# Patient Record
Sex: Female | Born: 1970 | Race: Black or African American | Hispanic: No | Marital: Married | Smoking: Never smoker
Health system: Southern US, Community
[De-identification: ages and names within clinical notes are randomized; demographics above are authoritative.]

## PROBLEM LIST (undated history)

## (undated) DIAGNOSIS — O24419 Gestational diabetes mellitus in pregnancy, unspecified control: Secondary | ICD-10-CM

## (undated) DIAGNOSIS — D219 Benign neoplasm of connective and other soft tissue, unspecified: Secondary | ICD-10-CM

## (undated) DIAGNOSIS — I10 Essential (primary) hypertension: Secondary | ICD-10-CM

## (undated) DIAGNOSIS — F329 Major depressive disorder, single episode, unspecified: Secondary | ICD-10-CM

## (undated) DIAGNOSIS — R51 Headache: Secondary | ICD-10-CM

## (undated) DIAGNOSIS — F32A Depression, unspecified: Secondary | ICD-10-CM

## (undated) DIAGNOSIS — J45909 Unspecified asthma, uncomplicated: Secondary | ICD-10-CM

## (undated) DIAGNOSIS — R011 Cardiac murmur, unspecified: Secondary | ICD-10-CM

## (undated) DIAGNOSIS — F419 Anxiety disorder, unspecified: Secondary | ICD-10-CM

## (undated) DIAGNOSIS — E049 Nontoxic goiter, unspecified: Secondary | ICD-10-CM

## (undated) DIAGNOSIS — E041 Nontoxic single thyroid nodule: Secondary | ICD-10-CM

## (undated) HISTORY — PX: TONSILLECTOMY: SUR1361

## (undated) HISTORY — PX: WISDOM TOOTH EXTRACTION: SHX21

## (undated) HISTORY — PX: CYST EXCISION: SHX5701

## (undated) HISTORY — PX: THYROIDECTOMY: SHX17

## (undated) HISTORY — PX: INDUCED ABORTION: SHX677

## (undated) SURGERY — Surgical Case
Anesthesia: *Unknown

---

## 2003-07-09 ENCOUNTER — Emergency Department (HOSPITAL_COMMUNITY): Admission: EM | Admit: 2003-07-09 | Discharge: 2003-07-09 | Payer: Self-pay | Admitting: Emergency Medicine

## 2003-08-28 ENCOUNTER — Encounter: Payer: Self-pay | Admitting: Obstetrics and Gynecology

## 2003-08-28 ENCOUNTER — Inpatient Hospital Stay (HOSPITAL_COMMUNITY): Admission: AD | Admit: 2003-08-28 | Discharge: 2003-08-28 | Payer: Self-pay | Admitting: Obstetrics and Gynecology

## 2003-11-20 ENCOUNTER — Ambulatory Visit (HOSPITAL_COMMUNITY): Admission: RE | Admit: 2003-11-20 | Discharge: 2003-11-20 | Payer: Self-pay | Admitting: *Deleted

## 2003-11-28 ENCOUNTER — Inpatient Hospital Stay (HOSPITAL_COMMUNITY): Admission: AD | Admit: 2003-11-28 | Discharge: 2003-11-28 | Payer: Self-pay | Admitting: *Deleted

## 2004-02-05 ENCOUNTER — Inpatient Hospital Stay (HOSPITAL_COMMUNITY): Admission: AD | Admit: 2004-02-05 | Discharge: 2004-02-09 | Payer: Self-pay | Admitting: Obstetrics & Gynecology

## 2004-02-12 ENCOUNTER — Encounter: Admission: RE | Admit: 2004-02-12 | Discharge: 2004-02-12 | Payer: Self-pay | Admitting: *Deleted

## 2004-02-19 ENCOUNTER — Encounter: Admission: RE | Admit: 2004-02-19 | Discharge: 2004-02-19 | Payer: Self-pay | Admitting: *Deleted

## 2004-02-21 ENCOUNTER — Inpatient Hospital Stay (HOSPITAL_COMMUNITY): Admission: AD | Admit: 2004-02-21 | Discharge: 2004-02-21 | Payer: Self-pay | Admitting: Obstetrics & Gynecology

## 2004-02-25 ENCOUNTER — Encounter: Admission: RE | Admit: 2004-02-25 | Discharge: 2004-02-25 | Payer: Self-pay | Admitting: Obstetrics & Gynecology

## 2004-02-27 ENCOUNTER — Encounter: Admission: RE | Admit: 2004-02-27 | Discharge: 2004-02-27 | Payer: Self-pay | Admitting: *Deleted

## 2004-03-04 ENCOUNTER — Encounter: Admission: RE | Admit: 2004-03-04 | Discharge: 2004-03-04 | Payer: Self-pay | Admitting: *Deleted

## 2004-03-11 ENCOUNTER — Inpatient Hospital Stay (HOSPITAL_COMMUNITY): Admission: RE | Admit: 2004-03-11 | Discharge: 2004-03-14 | Payer: Self-pay | Admitting: Obstetrics and Gynecology

## 2004-03-11 ENCOUNTER — Encounter: Admission: RE | Admit: 2004-03-11 | Discharge: 2004-03-11 | Payer: Self-pay | Admitting: *Deleted

## 2004-03-18 ENCOUNTER — Encounter: Admission: RE | Admit: 2004-03-18 | Discharge: 2004-03-18 | Payer: Self-pay | Admitting: *Deleted

## 2004-03-21 ENCOUNTER — Inpatient Hospital Stay (HOSPITAL_COMMUNITY): Admission: AD | Admit: 2004-03-21 | Discharge: 2004-03-22 | Payer: Self-pay | Admitting: Obstetrics and Gynecology

## 2004-03-25 ENCOUNTER — Encounter: Admission: RE | Admit: 2004-03-25 | Discharge: 2004-03-25 | Payer: Self-pay | Admitting: *Deleted

## 2004-04-01 ENCOUNTER — Encounter: Admission: RE | Admit: 2004-04-01 | Discharge: 2004-04-01 | Payer: Self-pay | Admitting: Obstetrics & Gynecology

## 2004-04-08 ENCOUNTER — Encounter: Admission: RE | Admit: 2004-04-08 | Discharge: 2004-04-08 | Payer: Self-pay | Admitting: *Deleted

## 2004-04-09 ENCOUNTER — Inpatient Hospital Stay (HOSPITAL_COMMUNITY): Admission: AD | Admit: 2004-04-09 | Discharge: 2004-04-11 | Payer: Self-pay | Admitting: Obstetrics and Gynecology

## 2006-04-20 ENCOUNTER — Inpatient Hospital Stay (HOSPITAL_COMMUNITY): Admission: RE | Admit: 2006-04-20 | Discharge: 2006-04-25 | Payer: Self-pay | Admitting: Psychiatry

## 2006-04-21 ENCOUNTER — Ambulatory Visit: Payer: Self-pay | Admitting: Psychiatry

## 2007-09-10 ENCOUNTER — Emergency Department (HOSPITAL_COMMUNITY): Admission: EM | Admit: 2007-09-10 | Discharge: 2007-09-10 | Payer: Self-pay | Admitting: Family Medicine

## 2008-08-22 ENCOUNTER — Emergency Department (HOSPITAL_COMMUNITY): Admission: EM | Admit: 2008-08-22 | Discharge: 2008-08-22 | Payer: Self-pay | Admitting: Family Medicine

## 2008-08-22 ENCOUNTER — Inpatient Hospital Stay (HOSPITAL_COMMUNITY): Admission: AD | Admit: 2008-08-22 | Discharge: 2008-08-22 | Payer: Self-pay | Admitting: Gynecology

## 2008-08-26 ENCOUNTER — Ambulatory Visit: Payer: Self-pay | Admitting: *Deleted

## 2008-09-03 ENCOUNTER — Ambulatory Visit (HOSPITAL_COMMUNITY): Admission: RE | Admit: 2008-09-03 | Discharge: 2008-09-03 | Payer: Self-pay | Admitting: Obstetrics & Gynecology

## 2008-09-25 ENCOUNTER — Ambulatory Visit: Payer: Self-pay | Admitting: Obstetrics and Gynecology

## 2008-09-25 ENCOUNTER — Encounter: Payer: Self-pay | Admitting: Obstetrics and Gynecology

## 2008-10-01 ENCOUNTER — Ambulatory Visit: Payer: Self-pay | Admitting: Family Medicine

## 2008-10-01 LAB — CONVERTED CEMR LAB
ALT: 15 units/L (ref 0–35)
AST: 18 units/L (ref 0–37)
Albumin: 4.6 g/dL (ref 3.5–5.2)
Alkaline Phosphatase: 50 units/L (ref 39–117)
BUN: 12 mg/dL (ref 6–23)
Basophils Absolute: 0 10*3/uL (ref 0.0–0.1)
Basophils Relative: 0 % (ref 0–1)
CO2: 22 meq/L (ref 19–32)
Calcium: 9.2 mg/dL (ref 8.4–10.5)
Chloride: 101 meq/L (ref 96–112)
Cholesterol: 162 mg/dL (ref 0–200)
Creatinine, Ser: 0.68 mg/dL (ref 0.40–1.20)
Eosinophils Absolute: 0.2 10*3/uL (ref 0.0–0.7)
Eosinophils Relative: 2 % (ref 0–5)
Free T4: 1.48 ng/dL (ref 0.89–1.80)
Free Thyroxine Index: 3.7 (ref 1.0–3.9)
Glucose, Bld: 83 mg/dL (ref 70–99)
HCT: 38.6 % (ref 36.0–46.0)
HDL: 41 mg/dL (ref >39)
Hemoglobin: 13.3 g/dL (ref 12.0–15.0)
LDL Cholesterol: 112 mg/dL — ABNORMAL HIGH (ref 0–99)
Lymphocytes Relative: 30 % (ref 12–46)
Lymphs Abs: 2.4 10*3/uL (ref 0.7–4.0)
MCHC: 34.5 g/dL (ref 30.0–36.0)
MCV: 81.4 fL (ref 78.0–100.0)
Monocytes Absolute: 0.5 10*3/uL (ref 0.1–1.0)
Monocytes Relative: 7 % (ref 3–12)
Neutro Abs: 5 10*3/uL (ref 1.7–7.7)
Neutrophils Relative %: 61 % (ref 43–77)
Platelets: 225 10*3/uL (ref 150–400)
Potassium: 3.6 meq/L (ref 3.5–5.3)
RBC: 4.74 M/uL (ref 3.87–5.11)
RDW: 14.1 % (ref 11.5–15.5)
Sodium: 137 meq/L (ref 135–145)
T3 Uptake Ratio: 34.1 % (ref 22.5–37.0)
T4, Total: 10.8 ug/dL (ref 5.0–12.5)
TSH: 0.92 microintl units/mL (ref 0.350–4.50)
Total Bilirubin: 0.6 mg/dL (ref 0.3–1.2)
Total CHOL/HDL Ratio: 4
Total Protein: 8.1 g/dL (ref 6.0–8.3)
Triglycerides: 44 mg/dL (ref <150)
VLDL: 9 mg/dL (ref 0–40)
Vit D, 1,25-Dihydroxy: 12 — ABNORMAL LOW (ref 30–89)
WBC: 8.1 10*3/uL (ref 4.0–10.5)

## 2008-12-04 ENCOUNTER — Ambulatory Visit: Payer: Self-pay | Admitting: Family Medicine

## 2008-12-04 LAB — CONVERTED CEMR LAB
HCT: 38.4 % (ref 36.0–46.0)
Hemoglobin: 13.5 g/dL (ref 12.0–15.0)
MCHC: 35.2 g/dL (ref 30.0–36.0)
MCV: 81.7 fL (ref 78.0–100.0)
Platelets: 223 10*3/uL (ref 150–400)
RBC: 4.7 M/uL (ref 3.87–5.11)
RDW: 14.1 % (ref 11.5–15.5)
TSH: 0.82 microintl units/mL (ref 0.350–4.50)
WBC: 8.1 10*3/uL (ref 4.0–10.5)

## 2008-12-05 ENCOUNTER — Emergency Department (HOSPITAL_COMMUNITY): Admission: EM | Admit: 2008-12-05 | Discharge: 2008-12-05 | Payer: Self-pay | Admitting: Family Medicine

## 2009-01-02 ENCOUNTER — Ambulatory Visit: Payer: Self-pay | Admitting: Obstetrics and Gynecology

## 2009-01-02 ENCOUNTER — Other Ambulatory Visit: Admission: RE | Admit: 2009-01-02 | Discharge: 2009-01-02 | Payer: Self-pay | Admitting: Obstetrics and Gynecology

## 2009-01-17 ENCOUNTER — Ambulatory Visit: Payer: Self-pay | Admitting: Obstetrics and Gynecology

## 2009-01-22 ENCOUNTER — Ambulatory Visit: Payer: Self-pay | Admitting: Family Medicine

## 2009-02-08 ENCOUNTER — Emergency Department (HOSPITAL_COMMUNITY): Admission: EM | Admit: 2009-02-08 | Discharge: 2009-02-09 | Payer: Self-pay | Admitting: Emergency Medicine

## 2009-02-13 ENCOUNTER — Ambulatory Visit: Payer: Self-pay | Admitting: Family Medicine

## 2009-02-27 ENCOUNTER — Ambulatory Visit: Payer: Self-pay | Admitting: Family Medicine

## 2009-02-27 LAB — CONVERTED CEMR LAB
Free T4: 1.16 ng/dL (ref 0.89–1.80)
T3 Uptake Ratio: 27.2 % (ref 22.5–37.0)
T4, Total: 15.4 ug/dL — ABNORMAL HIGH (ref 5.0–12.5)

## 2009-03-17 ENCOUNTER — Ambulatory Visit (HOSPITAL_COMMUNITY): Admission: RE | Admit: 2009-03-17 | Discharge: 2009-03-17 | Payer: Self-pay | Admitting: Family Medicine

## 2009-05-13 ENCOUNTER — Emergency Department (HOSPITAL_COMMUNITY): Admission: EM | Admit: 2009-05-13 | Discharge: 2009-05-13 | Payer: Self-pay | Admitting: Emergency Medicine

## 2009-05-20 ENCOUNTER — Ambulatory Visit: Payer: Self-pay | Admitting: Family Medicine

## 2009-06-24 ENCOUNTER — Encounter (INDEPENDENT_AMBULATORY_CARE_PROVIDER_SITE_OTHER): Payer: Self-pay | Admitting: Interventional Radiology

## 2009-06-24 ENCOUNTER — Ambulatory Visit (HOSPITAL_COMMUNITY): Admission: RE | Admit: 2009-06-24 | Discharge: 2009-06-24 | Payer: Self-pay | Admitting: Family Medicine

## 2009-09-02 ENCOUNTER — Ambulatory Visit: Payer: Self-pay | Admitting: Family Medicine

## 2009-09-02 ENCOUNTER — Telehealth (INDEPENDENT_AMBULATORY_CARE_PROVIDER_SITE_OTHER): Payer: Self-pay | Admitting: *Deleted

## 2009-09-02 LAB — CONVERTED CEMR LAB
BUN: 8 mg/dL (ref 6–23)
CO2: 26 meq/L (ref 19–32)
Calcium: 9.3 mg/dL (ref 8.4–10.5)
Chloride: 100 meq/L (ref 96–112)
Creatinine, Ser: 0.63 mg/dL (ref 0.40–1.20)
Glucose, Bld: 67 mg/dL — ABNORMAL LOW (ref 70–99)
Potassium: 3.7 meq/L (ref 3.5–5.3)
Sodium: 140 meq/L (ref 135–145)
T3, Total: 181.6 ng/dL (ref 80.0–204.0)
T4, Total: 13.9 ug/dL — ABNORMAL HIGH (ref 5.0–12.5)
Vit D, 25-Hydroxy: 31 ng/mL (ref 30–89)

## 2009-10-11 ENCOUNTER — Emergency Department (HOSPITAL_COMMUNITY): Admission: EM | Admit: 2009-10-11 | Discharge: 2009-10-11 | Payer: Self-pay | Admitting: Family Medicine

## 2010-02-12 ENCOUNTER — Ambulatory Visit: Payer: Self-pay | Admitting: Family Medicine

## 2010-02-12 LAB — CONVERTED CEMR LAB
BUN: 11 mg/dL (ref 6–23)
CO2: 25 meq/L (ref 19–32)
Calcium: 9.8 mg/dL (ref 8.4–10.5)
Chloride: 98 meq/L (ref 96–112)
Creatinine, Ser: 0.71 mg/dL (ref 0.40–1.20)
Free T4: 1.13 ng/dL (ref 0.80–1.80)
Glucose, Bld: 118 mg/dL — ABNORMAL HIGH (ref 70–99)
Helicobacter Pylori Antibody-IgG: >8 — ABNORMAL HIGH
Potassium: 3.2 meq/L — ABNORMAL LOW (ref 3.5–5.3)
Sodium: 137 meq/L (ref 135–145)
T3 Uptake Ratio: 31.9 % (ref 22.5–37.0)

## 2010-02-25 ENCOUNTER — Telehealth (INDEPENDENT_AMBULATORY_CARE_PROVIDER_SITE_OTHER): Payer: Self-pay | Admitting: Family Medicine

## 2010-03-05 ENCOUNTER — Ambulatory Visit: Payer: Self-pay | Admitting: Family Medicine

## 2010-03-05 LAB — CONVERTED CEMR LAB
BUN: 10 mg/dL (ref 6–23)
CO2: 24 meq/L (ref 19–32)
Calcium: 9.4 mg/dL (ref 8.4–10.5)
Chloride: 100 meq/L (ref 96–112)
Creatinine, Ser: 0.67 mg/dL (ref 0.40–1.20)
Glucose, Bld: 75 mg/dL (ref 70–99)
Potassium: 3.7 meq/L (ref 3.5–5.3)
Sodium: 137 meq/L (ref 135–145)

## 2010-04-17 ENCOUNTER — Ambulatory Visit: Payer: Self-pay | Admitting: Family Medicine

## 2010-05-13 ENCOUNTER — Ambulatory Visit: Payer: Self-pay | Admitting: Obstetrics and Gynecology

## 2010-05-29 ENCOUNTER — Ambulatory Visit: Payer: Self-pay | Admitting: Obstetrics & Gynecology

## 2010-06-11 ENCOUNTER — Ambulatory Visit: Payer: Self-pay | Admitting: Obstetrics and Gynecology

## 2010-06-12 ENCOUNTER — Inpatient Hospital Stay (HOSPITAL_COMMUNITY): Admission: AD | Admit: 2010-06-12 | Discharge: 2010-06-13 | Payer: Self-pay | Admitting: Obstetrics & Gynecology

## 2010-06-13 ENCOUNTER — Emergency Department (HOSPITAL_COMMUNITY): Admission: EM | Admit: 2010-06-13 | Discharge: 2010-06-13 | Payer: Self-pay | Admitting: Family Medicine

## 2010-06-14 IMAGING — US US BIOPSY
1 series · 14 of 20 positions shown · non-contrast
Comparison: none

CLINICAL DATA: Thyroid nodules; dominant left nodule 4.5 cm

ULTRASOUND-GUIDED NEEDLE ASPIRATE BIOPSY, LEFT LOBE OF THYROID
The above procedure was discussed with the patient and written
informed consent was obtained.

[Series 1: us biopsy · 0.11mm/px · 14 of 20 slices shown]
[im 1/20]
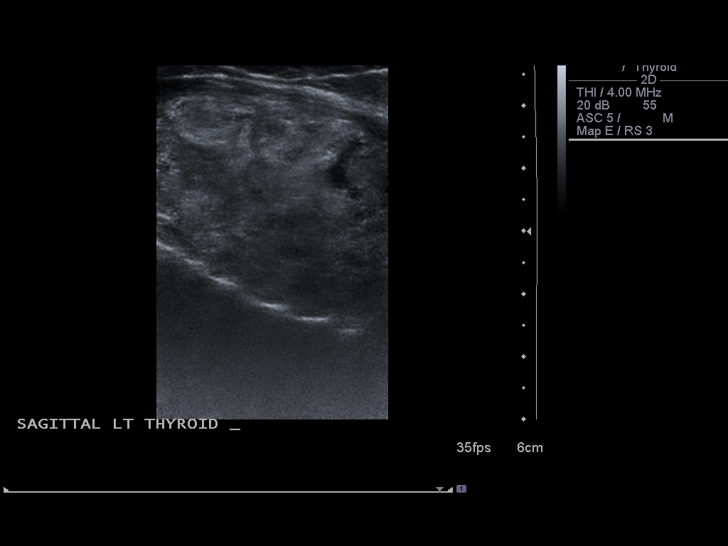
[im 3/20]
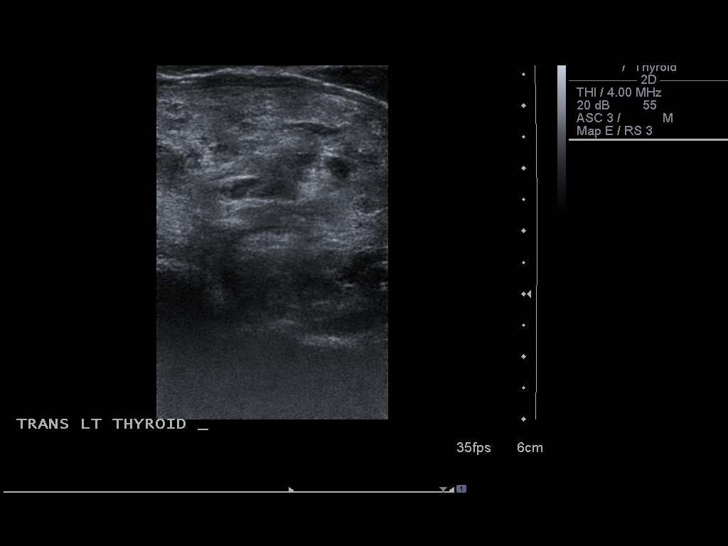
[im 4/20]
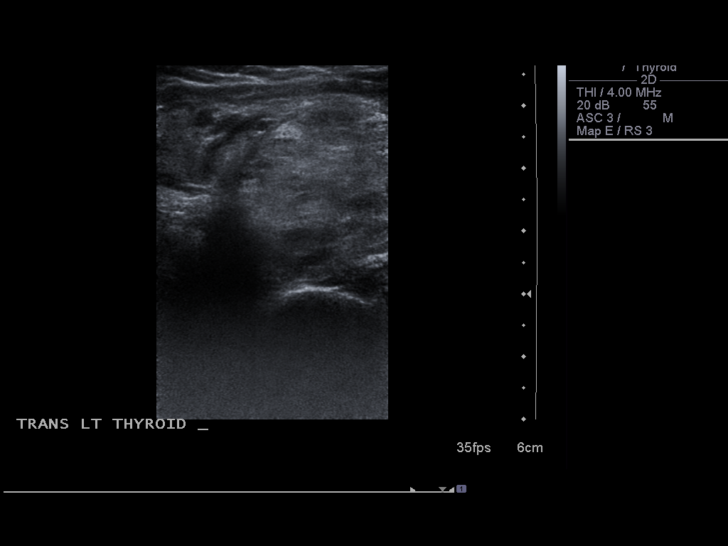
[im 6/20]
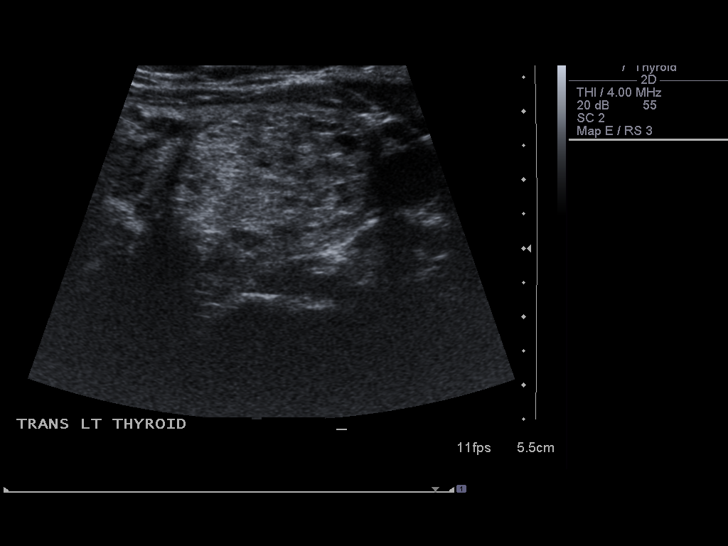
[im 7/20]
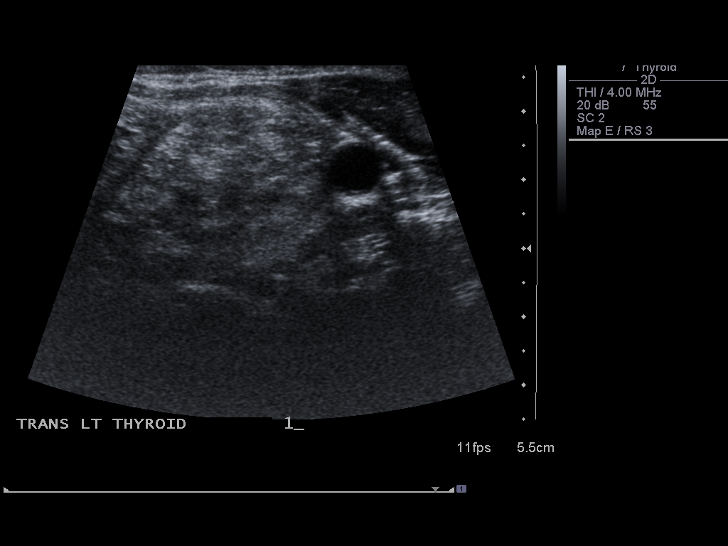
[im 8/20]
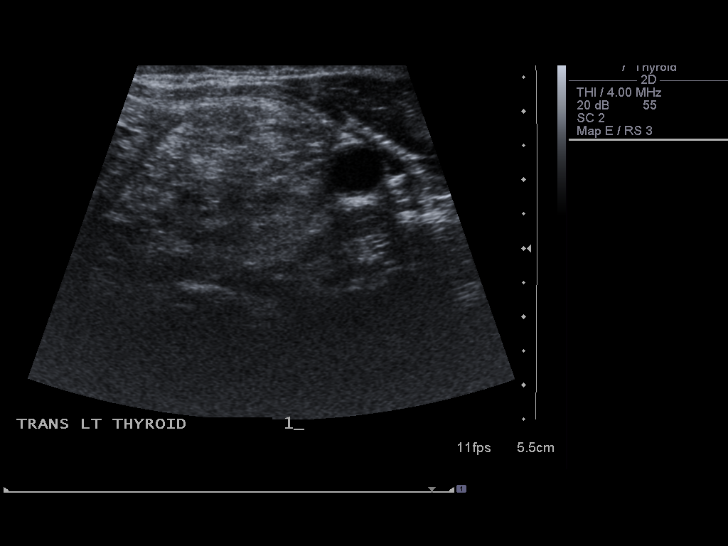
[im 10/20]
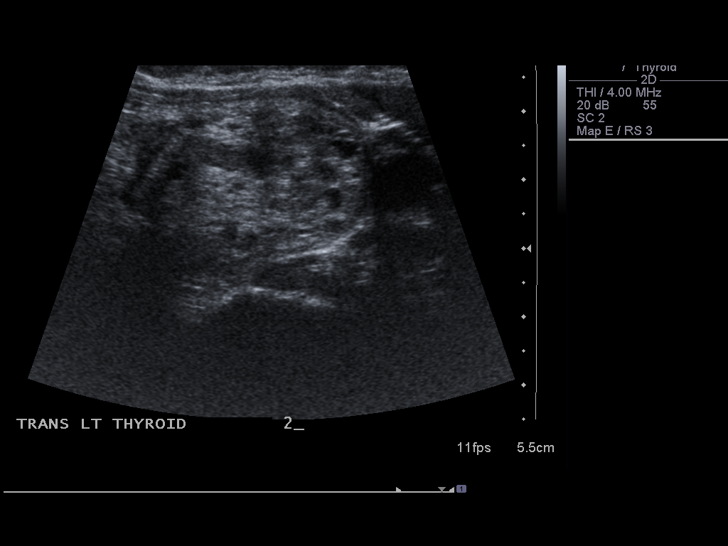
[im 11/20]
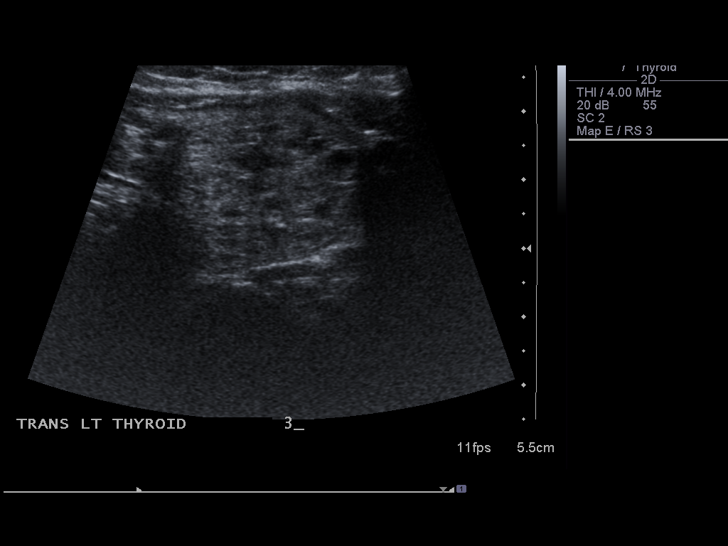
[im 13/20]
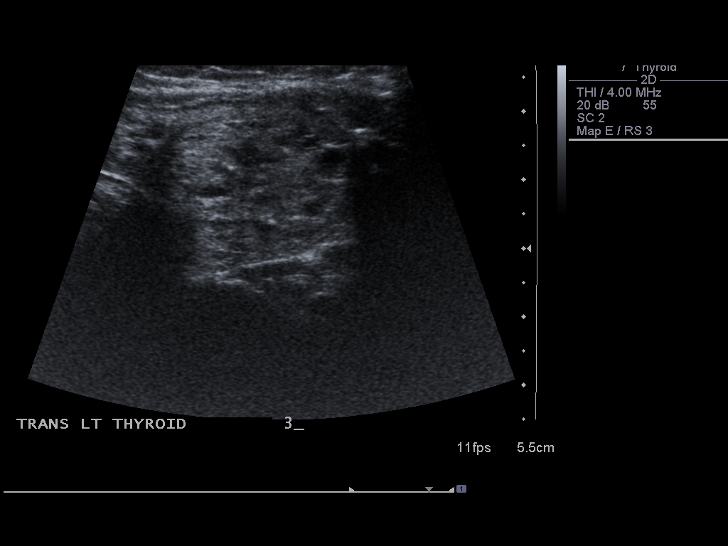
[im 14/20]
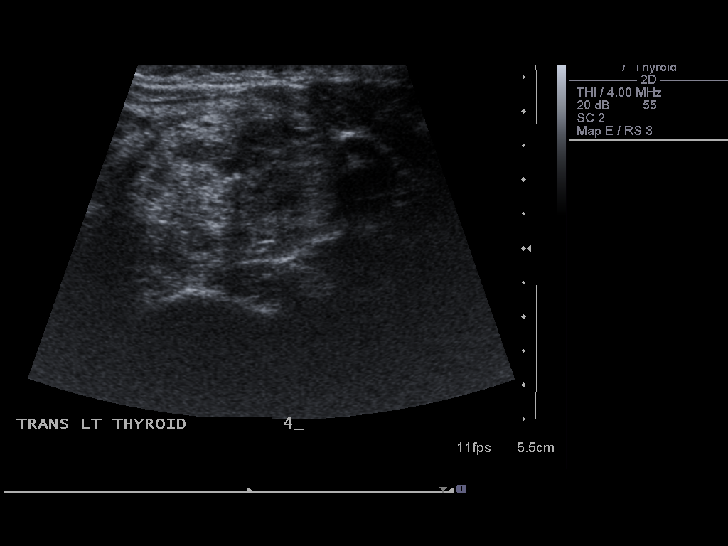
[im 16/20]
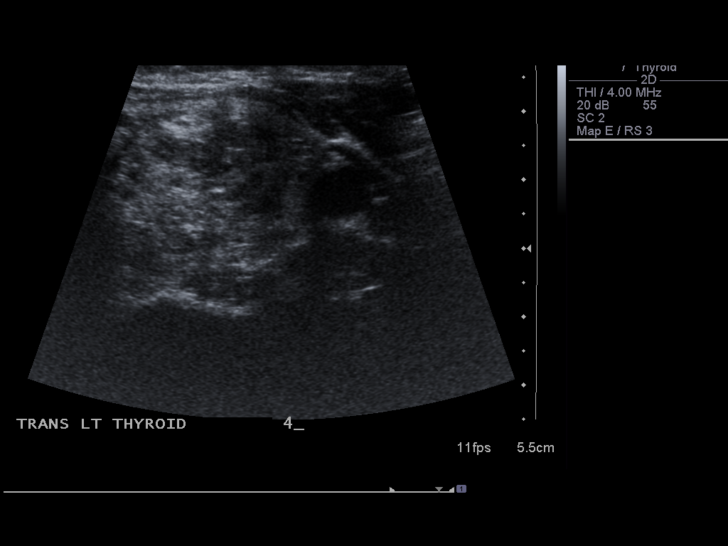
[im 17/20]
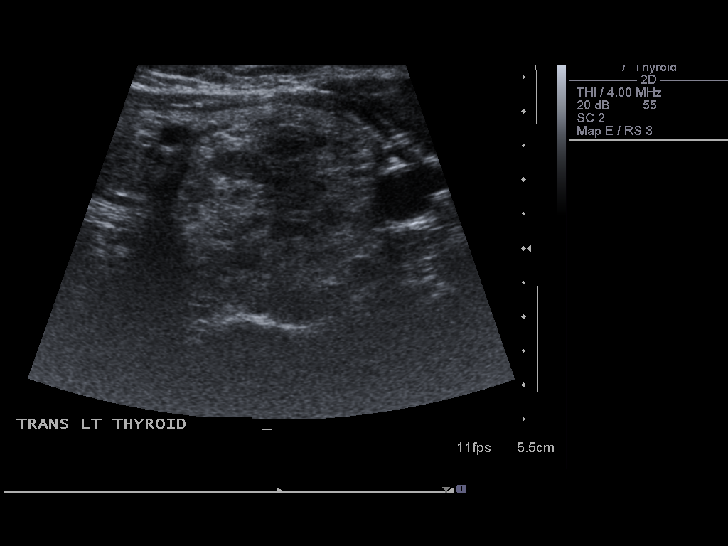
[im 18/20]
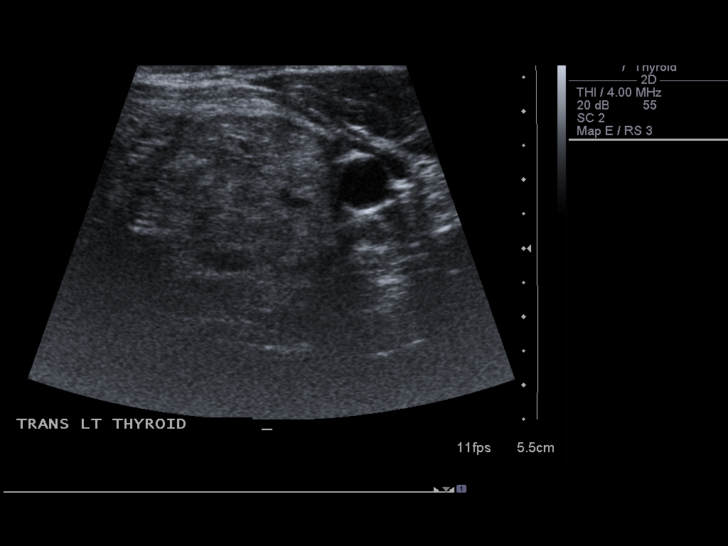
[im 20/20]
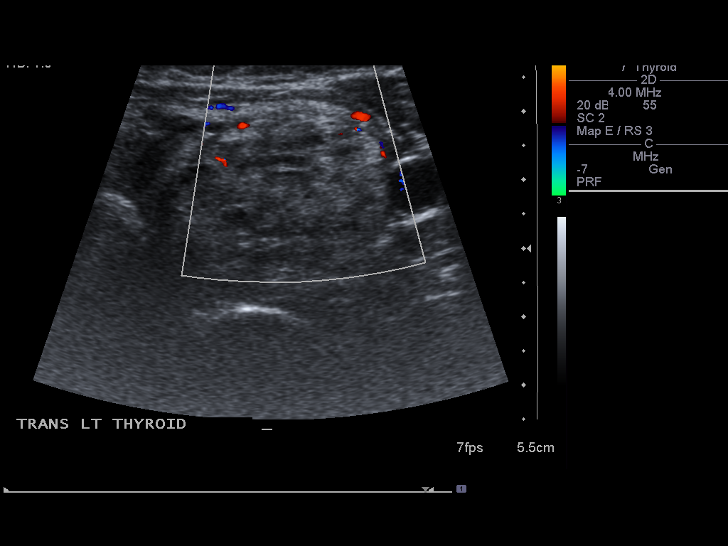

[14 of 20 positions shown; findings below may reference images not displayed]

FINDINGS: Ultrasound was performed to localize and mark an adequate
site for the biopsy.  The patient was then prepped and draped in a
normal sterile fashion.  Local anesthesia was provided with 1%
lidocaine.  Using direct ultrasound guidance, 4 passes were made
using 25g needles into the nodule within the left lobe of the
thyroid.  Ultrasound was used to confirm needle placements on all
occasions.  Specimens were sent to Pathology for analysis.  Post
procedural imaging demonstrated no hematoma or immediate
complication.  The patient tolerated the procedure well.
IMPRESSION: Successful ultrasound guided biopsy of left dominant
thyroid nodule.  Pathology pending.

Read by: Gavani, [employer]KOSE

## 2010-06-15 ENCOUNTER — Inpatient Hospital Stay (HOSPITAL_COMMUNITY): Admission: AD | Admit: 2010-06-15 | Discharge: 2010-06-15 | Payer: Self-pay | Admitting: Family Medicine

## 2011-01-05 NOTE — Progress Notes (Signed)
Summary: constipation, Abd pain  Phone Note Call from Patient Call back at Home Phone [phone]   Caller: Patient Summary of Call: Pt complains of excess flatus, constipation, abd pain since she started Prev Pak. Usually has BM daily none since Monday. Encouraged to take OTC Miralax drink plenty of fluids. Eat at least 2 hrs. prior to lying down, elevate HOB, excercise, avoid hot spicey foods. Call if pain gets worse or  develops fever, continue Pre Pak as directed. Pt verbalized understanding. Encouraged her to check Web MD for more information on H. Pylori and GERD. Initial call taken by: Gaylyn Cheers RN,  February 25, 2010 9:22 AM

## 2011-01-06 ENCOUNTER — Inpatient Hospital Stay (HOSPITAL_COMMUNITY)
Admission: RE | Admit: 2011-01-06 | Discharge: 2011-01-06 | Disposition: A | Discharge status: Home / Self Care | Payer: Self-pay | Source: Ambulatory Visit | Attending: Family Medicine | Admitting: Family Medicine

## 2011-01-06 DIAGNOSIS — T169XXA Foreign body in ear, unspecified ear, initial encounter: Secondary | ICD-10-CM

## 2011-02-21 LAB — POCT URINALYSIS DIP (DEVICE)
Bilirubin Urine: NEGATIVE
Glucose, UA: NEGATIVE mg/dL
Nitrite: NEGATIVE
Protein, ur: NEGATIVE mg/dL
Specific Gravity, Urine: >=1.03 (ref 1.005–1.030)
Urobilinogen, UA: 0.2 mg/dL (ref 0.0–1.0)
pH: 5 (ref 5.0–8.0)

## 2011-02-21 LAB — POCT PREGNANCY, URINE
Preg Test, Ur: NEGATIVE
Preg Test, Ur: NEGATIVE

## 2011-02-21 LAB — CBC
HCT: 38.5 % (ref 36.0–46.0)
Hemoglobin: 13 g/dL (ref 12.0–15.0)
MCH: 30 pg (ref 26.0–34.0)
MCHC: 33.8 g/dL (ref 30.0–36.0)
MCV: 88.8 fL (ref 78.0–100.0)
Platelets: 193 10*3/uL (ref 150–400)
RBC: 4.33 MIL/uL (ref 3.87–5.11)
RDW: 13.9 % (ref 11.5–15.5)
WBC: 9.6 10*3/uL (ref 4.0–10.5)

## 2011-02-21 LAB — URINALYSIS, ROUTINE W REFLEX MICROSCOPIC
Bilirubin Urine: NEGATIVE
Glucose, UA: NEGATIVE mg/dL
Ketones, ur: NEGATIVE mg/dL
Nitrite: NEGATIVE
Protein, ur: NEGATIVE mg/dL
Specific Gravity, Urine: 1.01 (ref 1.005–1.030)
Urobilinogen, UA: 0.2 mg/dL (ref 0.0–1.0)
pH: 5 (ref 5.0–8.0)

## 2011-02-21 LAB — URINE MICROSCOPIC-ADD ON: WBC, UA: NONE SEEN WBC/hpf (ref <3)

## 2011-02-22 LAB — POCT PREGNANCY, URINE: Preg Test, Ur: NEGATIVE

## 2011-03-22 LAB — POCT PREGNANCY, URINE: Preg Test, Ur: NEGATIVE

## 2011-04-20 NOTE — Group Therapy Note (Signed)
NAME:  Carly Lynch, Carly Lynch NO.:  [id number]   MEDICAL RECORD NO.:  [id number]          PATIENT TYPE:  WOC   LOCATION:  [facility]                   FACILITY:  [facility]   PHYSICIAN:  Dr. Debroah Loop             DATE OF BIRTH:  [birth year]   DATE OF SERVICE:                                  CLINIC NOTE   CHIEF COMPLAINT:  Heavy periods.   DIAGNOSIS:  Menometrorrhagia, secondary to fibroids.   HISTORY:  The patient is a 40 year old black female, gravida 6, para 3,  0, 3, 3, whose last menstrual period started about the 18th of January,  but she has been having irregular bleeding and bleeding with  intercourse.  She also has lower abdominal cramping.  She has a  diagnosis of fibroid uterus with ultrasound on 09/03/08, showing a 10.4  cm x 6.6 cm x 6.0 cm uterus with three myometrial fibroids, the largest  measuring 2.6 cm.  She had previously had a Peri-Guard.  She also had  tried Mirena but felt that she had hormonal side effects.  She took  Loestrin for about one month, but stopped this when she was seen on  12/04/08, at this clinic.  She is considering a hysterectomy.  She has  started treatment for hypertension.   PAST MEDICAL HISTORY:  Hypertension.   MEDICATIONS:  1. Hydrochlorothiazide 25 mg daily.  2. Beet juice powder daily.   ALLERGIES:  PERCOCET.   PAST SURGICAL HISTORY:  Tonsillectomy.   FAMILY HISTORY:  Diabetes, hypertension and colon cancer.   SOCIAL HISTORY:  The patient is married.  She is a nonsmoker.  She  denies alcohol or drug use.   PHYSICAL EXAMINATION:  GENERAL:  The patient is in no acute distress.  VITAL SIGNS:  Weight 179 pounds, height 5 feet 5 inches, blood pressure  136/90, pulse 71, temperature 98.2 degrees.  ABDOMEN:  Soft, nontender.  No mass.  PELVIC EXAM:  External genitalia benign.  Cervix showed moderate blood.  Cervix appeared normal.  Uterus is about 8 to 10 week size.  No adnexal  masses.   I discussed evaluation with an  endometrial sampling.  I explained the  procedure and the risks of bleeding, infection, uterine damage and pain.  She signed a consent.   PROCEDURE:  The cervix was prepped with Betadine and a Milex sampler was  passed to 10 cm and a bloody specimen was obtained and sent to  pathology.  She tolerated the procedure well.   IMPRESSION:  Menometrorrhagia, likely secondary to fibroid uterus.   PLAN:  The patient discussed a hysterectomy and the fact that she is a  candidate for a vaginal hysterectomy.  She will return in two weeks to  schedule surgery.           ______________________________  Dr. Hoyle Barr  D:  01/02/2009  T:  01/02/2009  Job:  [id number]

## 2011-04-20 NOTE — Group Therapy Note (Signed)
NAME:  Carly Lynch, Carly Lynch NO.:  [id number]   MEDICAL RECORD NO.:  [id number]          PATIENT TYPE:  WOC   LOCATION:  [facility]                   FACILITY:  [facility]   PHYSICIAN:  Argentina Donovan, MD        DATE OF BIRTH:  Dec 29, 1970   DATE OF SERVICE:  01/17/2009                                  CLINIC NOTE   The patient is a 40 year old African American, gravida 6, para 3-0-3-3,  with severe dysmenorrhea, fibroid uterus, with no apparent submucous  component on ultrasound, and severe dysmenorrhea.  She has tried [place]  but had problems because of weight gain and headaches from the hormonal  effect.  We discussed the alternatives to help her control this.  She  has recently been started on medication for hypertension and is getting  that under good control so I told her she could take the birth control  pill if that was the case.  She is a nonsmoker.  We are going to start  her at her request on oral contraceptives and start her on Loestrin 24  Fe and have her come back in a couple months and see how that works for  her.  I have also given her information on the hydrothermal endometrial  ablation and we discussed hysterectomy.  I have told her that if given  hysterectomy if one of her over complaints is midcycle pain around the  time of ovulation that if you leave the ovaries in that she will still  experience that problem.  There is a possibility of an adenomyosis  component to her dysmenorrhea so there is always that possibility that  the ablation will not be successful in that method but controlling heavy  bleeding it probably will.  She is going to come back in 2 months.  I  have given her the written information on an endometrial ablation.  She  has had the written information on hysterectomy.  We are trying her on  the oral contraceptives.   DIAGNOSIS:  Mittelschmerz with severe dysmenorrhea and menorrhagia and  uterine fibroids and she will make a decision on  treatment.           ______________________________  Argentina Donovan, MD     PR/MEDQ  D:  01/17/2009  T:  01/17/2009  Job:  [id number]

## 2011-04-20 NOTE — Group Therapy Note (Signed)
NAME:  Carly Lynch, Carly Lynch NO.:  [id number]   MEDICAL RECORD NO.:  [id number]          PATIENT TYPE:  WOC   LOCATION:  [facility]                   [facility]ACILITY[facility]   PHYSICIAN:  Argentina Donovan, MD        DATE OF BIRTH:  09/04/1971   DATE OF SERVICE:                                  CLINIC NOTE   The patient is a 40 year old African American female, gravida 6, para 3-  0-3-3 who had her last baby in 2005.  The last time she had a Pap smear  and apparently was abnormal, but she never had any followup.  She  presented at the [facility] with pelvic pain mainly during her period, but she  had been bleeding significantly for a couple of weeks at that time and  she said the pain with her periods has been very severe over the past  year.  An ultrasound was done.  The ovaries were normal.  The uterus  showed some small intramural fibroids, largest was 2.6 cm and an IUD was  in the uterine cavity.  This IUD, she has a ParaGard and sounded to me  as if the uterus is trying to expel this ParaGard.  She has had it in  for 4 years and I suggested that we get it removed and placed on oral  contraceptives for couple of months, see her back and then if the pain  is been taking care by that we could put her on the Mirena which she had  used in the past.  On examination, the external genitalia is normal.  BUS is within normal limits.  Vagina was clean and well rugated.  The  cervix was clean and far anterior and the uterus seems normal size,  shape, and consistency.  On pelvic exam, it appears acutely retroverted.  Adnexa is normal.  Pap smear was taken.  There was some difficulty in  getting the cervix into the center of the speculum so that I could see  the string on the IUD, but we able to pull to find it and it seems that  the IUD was very low in the uterus, it may have been displaced and may  have been trying to be expelled by this.  She said she almost fell  immediate relief when we removed  it from her pelvic pressure.  We are  going to put her on Loestrin 24 FE for 2 months and then have a come  back and see how she does.  If she is doing well, she can even have the  Mirena, stay on the pill.  If she is going to try and talk with her  husband a vasectomy.   IMPRESSION:  Pelvic pain, intrauterine device removed.  The patient  placed on oral contraceptives and will have a followup in 2 months to  see if that took care of her problem.           ______________________________  Argentina Donovan, MD     PR/MEDQ  D:  09/25/2008  T:  09/26/2008  Job:  [id number]

## 2011-04-20 NOTE — Group Therapy Note (Signed)
NAME:  Carly Lynch, Carly Lynch NO.:  [id number]   MEDICAL RECORD NO.:  [id number]          PATIENT TYPE:  WOC   [facility]ATION:  [facility]                   [facility]   PHYSICIAN:  Tinnie Gens, MD        DATE OF BIRTH:  1971-02-27   DATE OF SERVICE:  12/04/2008                                  CLINIC NOTE   CHIEF COMPLAINT:  Follow-up menorrhagia.   HISTORY OF PRESENT ILLNESS:  The patient is a 40 year old gravida 6,  para 3-0-3-3 who previously had an IUD inserted that was a copper IUD.  This was removed back in 09/2008, causing pain and bleeding.  Since that  time, they decided to start her on Loestrin.  However, she never  actually took that until this month.  Since starting that, she has  continued to have some spotting.  The patient does have fibroids on  ultrasound.  The patient also has a very large goiter that she states  has been there for the past 4 years.  She states that someone has told  her previously her thyroid was underactive.  However, she said that  [facility] did some blood work on her and told her it was normal.  The  patient also sees them for hypertension and her blood pressure is up  further since starting Loestrin.  The patient has not seen anyone for  financial assistance.  The patient is interested in hysterectomy if  given the choice.   PHYSICAL EXAMINATION:  VITAL SIGNS:  On exam today, her vitals are as in  the chart.  Her blood pressure is 163/89, pulse is 76, weight is 177.9.  GENERAL:  She is a well-developed, well-nourished female in no acute  distress.  ABDOMEN:  Soft, nontender, nondistended.   IMPRESSION:  1. Menorrhagia.  2. Hypertension.  3. Goiter.   PLAN:  1. TSH today, CBC today.  2. Continue Loestrin.  3. Follow up with [facility] to ensure better blood pressure      control.  4. See Rudell Cobb.  5. After 2-3 months her bleeding is not well controlled, and if this      is not related to abnormal thyroid  function, patient will be a good      candidate for hysterectomy if she qualifies.           ______________________________  Tinnie Gens, MD     TP/MEDQ  D:  12/04/2008  T:  12/04/2008  Job:  [id number]

## 2011-04-23 NOTE — Discharge Summary (Signed)
NAME:  Carly Lynch, Carly Lynch                           ACCOUNT NO.:  [id number]   MEDICAL RECORD NO.:  [id number]                   PATIENT TYPE:  INP   LOCATION:  9157                                 [facility]   PHYSICIAN:  Lesly Dukes, M.D.              DATE OF BIRTH:  1971-06-10   DATE OF ADMISSION:  02/05/2004  DATE OF DISCHARGE:  02/09/2004                                 DISCHARGE SUMMARY   ADMISSION DIAGNOSES:  1. Twenty-nine and two-sevenths weeks intrauterine pregnancy.  2. Preterm labor.  3. Urinary tract infection.   DISCHARGE DIAGNOSES:  1. Stable preterm labor at 29 and five-sevenths weeks.  2. Urine culture negative.  3. Hyperglycemia post betamethasone treatment.   HISTORY:  This is a 40 year old G5 P2-0-2-2 who presented at 24 and two-  sevenths weeks dated by a 6 and two-sevenths week ultrasound who was  reporting crampy lower abdominal pain and also low back pain for 2 days.  She thought she was having contractions for a couple of weeks.  Her  membranes were intact.  She had no vaginal bleeding, good fetal activity.  She had been seen this same day at [facility] and had been told that her  cervix was thinner.  She was sent to [facility] for further  evaluation when the electronic fetal monitor showed contractions.  Her  prenatal course was complicated by being anemic and also having fibroids.  Medications were prenatal vitamins, iron, and also Prevacid.  Allergies were  PERCOCET.  OB history:  Term SVD x2 and two elective ABs.  GYN history  significant for an abnormal Pap smear 2 months ago, having had colposcopy.  Medical history significant for asthma in the past.  Surgeries:  Tonsillectomy at age 75.  Family history significant for diabetes and  hypertension.  Negative tobacco, alcohol, or drugs.  Prenatal labs:  She is  Rh positive, rubella immune, hemoglobin 10.7.  Cultures were all negative  including beta strep.  Her 1-hour GCT was 134.   PHYSICAL EXAMINATION:  HOSPITAL ADMISSION VITAL SIGNS:  Temperature 97.8,  pulse 89, respirations 20, blood pressure 130/65.  GENERAL:  She was in no apparent distress.  HEART:  RRR without murmur.  LUNGS:  Clear to auscultation.  ABDOMEN:  Gravid consistent with 29 weeks size, nontender.  EXTREMITIES:  Without edema.  NEUROLOGIC:  Intact.  PELVIC:  Digital exam:  Cervix was closed, 50%, soft, and on recheck it was  1 cm, 50%, and soft.   Fetal heart rate was 140 baseline, reactive, good variability, no  decelerations.  She was having mild contractions every 3 minutes.  Urinalysis was positive for leukocyte esterase and ketones.  On February 23  her GBS, chlamydia, and GC were all negative.   HOSPITAL COURSE:  The patient was admitted and given betamethasone x2 and  was begun on magnesium sulfate 4 g load and 2 g/hour.  She  was also begun on  Rocephin 1 g IV, then to begin Ancef q.8h.Marland Kitchen  She remained stable except for  some mild low back pain.  Of note, she did have some cervical bleeding after  her exam was done in the maternity admissions unit.  She was discontinued on  to Procardia on hospital day #2.  Vital signs remained stable.  Fetal heart  rate was reactive and she had only slight uterine irritability on tocometer.  Fundal height was 31.  Exam was internal os fingertip to closed, soft,  posterior, long, out of the pelvis.  There was some brown blood present and  therefore fetal fibronectin was not done.  Her urine culture of March 2 was  negative.  She was changed to Macrodantin. Though she had a history of  fibroids her ultrasounds both at 6 and 18 weeks did not show any fibroids.  Her blood sugar was elevated to 160 the day after she had betamethasone  first dose.  In consultation with Dr. Gavin Potters she was given Delalutin and  begun on Unasyn and CBGs.  Her CBGs improved as the betamethasone effect  wore off.  On March 6 she was describing mostly groin and lower pelvic pain   with fetal movement and had no further spotting.  Her vital signs were  stable.  Her fasting CBG was 90.  Abdomen was soft and nontender.  Tocometer  showed only occasional mild contraction.  Fetal heart rate was 145,  reactive, without decelerations.  Cervical exam was closed, long, and high.  She was discharged home, given a work excuse, put on bedrest with bathroom  privileges, pelvic precautions, and was continued on the Procardia.  She was  advised to stay on a low carbohydrate diet and to follow up at [facility] on February 12, 2004.  Discharge medications were Procardia, prenatal  vitamin and iron, and Prevacid.     Deirdre Christy Gentles, C.N.M.                       Lesly Dukes, M.D.    DP/MEDQ  D:  03/12/2004  T:  03/13/2004  Job:  [id number]

## 2011-04-23 NOTE — H&P (Signed)
Carly Lynch, GEBHART NO.:  [id number]   MEDICAL RECORD NO.:  [id number]          PATIENT TYPE:  IPS   LOCATION:  0501                          FACILITY:  [facility]   PHYSICIAN:  Anselm Jungling, MD  DATE OF BIRTH:  01-Feb-1971   DATE OF ADMISSION:  04/20/2006  DATE OF DISCHARGE:                         PSYCHIATRIC ADMISSION ASSESSMENT   IDENTIFYING INFORMATION:  The patient is a 40 year old married African-  American female voluntarily admitted on Apr 20, 2006.   HISTORY OF PRESENT ILLNESS:  The patient presents with a history of  increasing anxiety.  States that both her two children are ill.  Her husband  is also ill at this time.  She is unhappy with her job.  She has not been  sleeping well, only about 4-5 hours at a time.  She has lost her appetite.  She denies any current weight loss.  She is having suicidal thoughts to  either cut her wrists or drive her car and have a motor vehicle accident.  She has been hearing auditory hallucinations for the past 2-3 days telling  her to harm herself with the car accident.  The patient states that she was  recently started on an antidepressant.   PAST PSYCHIATRIC HISTORY:  First admission to [facility].  In  the past had been on Zoloft and recently has had some individual therapy.   SOCIAL HISTORY:  This is a 40 year old married African-American female,  married for two years, first marriage.  Has three children, ages 7, 58 and  2.  Children are with her husband and with her mother.  She lives with  husband and her children.  She works at [employer] in [employer].  No legal charges.  Recently moved from [place] about three years  ago.   FAMILY HISTORY:  Mother with depression, is on multiple medications.   ALCOHOL/DRUG HISTORY:  Nonsmoker.  Denies any alcohol or drug use.   PRIMARY CARE PHYSICIAN:  Dr. __________ in [place] at [facility].   MEDICAL PROBLEMS:  Asthma and  blood pressure.   MEDICATIONS:  Has been on hydrochlorothiazide 12.5 mg daily, Cymbalta 30 mg  for one week.  The patient does report some problems with diarrhea.   ALLERGIES:  PERCOCET (reports problems with hives).   PHYSICAL EXAMINATION:  Performed and chief complaint is depression and  suicidal thoughts and auditory hallucinations.   PAST MEDICAL HISTORY:  Asthma and hypertension.   ALLERGIES:  PERCOCET.   MEDICATIONS:  The patient is on Cymbalta 30 mg daily.   REVIEW OF SYSTEMS:  Positive for shortness of breath, positive for loss of  appetite, no fever, no chills, no chest pain, no nausea or vomiting,  positive for diarrhea, no visual disturbances, no falls, no seizures, no  dysuria, frequency, positive for depression, positive for suicidal thoughts.   PHYSICAL EXAMINATION:  VITAL SIGNS:  Temperature 98.5, heart rate 70,  respirations 20, blood pressure 162/100, O2 sat is 98%.  She is 5 feet 5  inches tall, weight 176 pounds.  GENERAL:  This is an overweight female  in no acute distress.  NECK:  Negative lymphadenopathy.  Trachea is midline.  CHEST:  Clear.  BREAST:  Exam is deferred.  HEART:  Regular rate and rhythm.  ABDOMEN:  Soft, nontender abdomen.  GU:  Exam deferred.  EXTREMITIES:  Moves all extremities, 5+ against resistance, no clubbing, no  edema.  SKIN:  Warm and dry.  No rashes or lacerations were noted.  NEUROLOGIC:  Findings are intact.  The patient easily performs heel to shin,  normal alternating movements.   LABORATORY DATA:  CBC is within normal limits.  Potassium 3.4.  TSH 0.408.  Pregnancy test is negative.  Drug screen is negative.  Urinalysis shows  small ketones.   MENTAL STATUS EXAM:  Fully alert, cooperative female in no acute distress.  Casually dressed.  Good eye contact.  Speech is clear, normal rate and tone,  articulate.  The patient is feeling down.  The patient is teary-eyed, very  polite.  Thought processes with patient endorsing  positive auditory  hallucinations.  Does not appear to be actively responding to internal  stimuli.  Endorsing suicidal thoughts.  Contracting for safety.  Cognitive  function intact.  Memory is good.  Judgment is fair.  Insight is fair.  Average intelligence.  Concentration intact.   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  AXIS II:  Deferred.  AXIS III:  Asthma and hypertension.  AXIS IV:  Problems with primary support group, with her children and husband  being ill, occupation, reporting lack of support with family at a distance,  does have a supportive husband and mother.  AXIS V:  Current 35.   PLAN:  To stabilize mood and thinking.  Contract for safety.  Will increase  Cymbalta for maximum effect.  The patient is to follow up with her  therapist.  Will have a family session with her husband.   TENTATIVE LENGTH OF STAY:  Four to five days.     Landry Corporal, N.P.      Anselm Jungling, MD  Electronically Signed   JO/MEDQ  D:  04/22/2006  T:  04/22/2006  Job:  [id number]

## 2011-04-23 NOTE — Discharge Summary (Signed)
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  She was admitted for stabilization of  severe depression.  She had been having thoughts of harming herself.  She  had had a history of depression that had been treated successfully the past  with antidepressants.  Please refer to the admission note for further  details pertaining to the symptoms, circumstances and history that led to  her hospitalization.  She was given an initial Axis I diagnosis of major  depressive disorder, recurrent without psychotic features.   MEDICAL AND LABORATORY:  The patient was medically and physically assessed  by the psychiatric nurse practitioner upon admission.  There were no  significant medical issues during this inpatient psychiatric stay.   HOSPITAL COURSE:  The patient was admitted to the adult inpatient  psychiatric service.  She presented as a well-nourished, well-developed  female who was pleasant, cooperative, well organized and had a strong desire  for help.  Her mood was depressed with sad and tearful affect.  She denied  suicidal ideation following her admission and stated that she had no desire  or plan to harm herself while in the hospital, or thereafter.  She did not  display any signs or symptoms of psychosis.   She had come to [place] on a trial of Cymbalta that her primary care physician  had started her on 3 days prior to admission.  She reported that she was  tolerating it well.  It was  continued, and increased to a dose of 60 mg  daily.  This was well tolerated, and the patient even remarked that she felt  that she was benefiting from it by the end of her stay.   She was a good participant in the treatment program.  There was also a  family session involving her husband on the third hospital day.  Session  also involved her mother.  She reported that she was feeling better in that  meeting and was not having suicidal ideation.  Husband stated that he would  give the patient more help with household chores and do a better job of  understanding her depression.  The patient's mother indicated that she would  assist the patient with caretaking of her children as well as some of the  household chores.  The patient commented that she felt that she had been in  denial about her depression but she realized that she needed medications and  therapy.  She indicated that she wanted to take some time off work prior to  returning to the bank.  The meeting was viewed as very supportive, positive  and successful.   AFTERCARE:  The patient was discharged on the sixth hospital day.  She was  in much better spirits, with a pleasant, positive affect.  She had been  consistently absent any thoughts of self-harm or suicide.  She appeared to  be tolerating her medication and was sleeping well.   She was to follow-up with [facility] on Apr 28, 2006, and  with Dr. Wynonia Lawman for medication on May 09, 2006.   DISCHARGE MEDICATIONS:  Cymbalta 60 mg daily.   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  AXIS II: Deferred.  AXIS III: No acute or chronic illnesses.  AXIS IV: Stressors severe.  AXIS V: Global assessment of functioning on discharge 70,           ______________________________  Anselm Jungling, MD  Electronically Signed     SPB/MEDQ  D:  04/26/2006  T:  04/26/2006  Job:  [id number]

## 2011-04-23 NOTE — Discharge Summary (Signed)
NAME:  Carly Lynch, Anjuli                           ACCOUNT NO.:  [id number]   MEDICAL RECORD NO.:  [id number]                   PATIENT TYPE:  INP   LOCATION:  9154                                 FACILITY:  [facility]   PHYSICIAN:  Phil D. Okey Dupre, M.D.                  DATE OF BIRTH:  05/30/1971   DATE OF ADMISSION:  03/11/2004  DATE OF DISCHARGE:  03/14/2004                                 DISCHARGE SUMMARY   DISCHARGE DIAGNOSES:  1. Threatened preterm labor.  2. Group B streptococcus.  3. Diet controlled gestational diabetes.  4. Uterine fibroid.   DISCHARGE MEDICATIONS:  1. Iron sulfate 325 mg once daily.  2. Prenatal vitamins once daily.   DISCHARGE INSTRUCTIONS:  1. The patient was instructed to go home with bed rest.  2. The patient was instructed to return if contraction intensity     significantly increased while at home.   FOLLOW UP:  The patient was instructed to call the high risk clinic for  followup appointment.   HISTORY OF PRESENT ILLNESS:  Ms. Carly Lynch is a 40 year old G5,P2,0,2,2, at 34  weeks and two days on day of admission who was admitted for preterm uterine  contractions.  The patient denied any vaginal bleeding or gush of water  concerning for membrane rupture.  She states that she was feeling  contractions off and on for the last few days.  However, she was also  feeling good fetal activity.   PHYSICAL EXAMINATION:  VITAL SIGNS:  Temperature 98.0, pulse 83,  respirations 20, blood pressure 114/68.  VAGINAL:  Cervical exam revealed cervix dilated to 2 cm, 50% effaced, 0  station.   Fetal monitoring revealed a fetal heart rate of 140s with positive  reactivity and variability.  Contraction frequency was every two to five  minutes on tocometer.   LABORATORY DATA:  Upon admission white blood cell count 9.3, hemoglobin  12.3, platelets 166, sodium 137, potassium 4.0, chloride 106, bicarb 23,  glucose 79, BUN 5, creatinine 0.5, calcium 9.1, AST 44, ALT 22, alk phos  81,  total bilirubin 0.9, LDH 332, uric acid 4.7, total protein 6.5, albumin 2.9.  Urinalysis revealed specific gravity of 1.025, trace hemoglobin, negative  nitrite and leukocyte esterase, 0-2 red blood cells with few bacteria.   IMAGING:  OB ultrasound performed on March 11, 2004.   RESULTS:  Single living intrauterine fetus measuring 35 weeks and 2 days  with estimated fetal weight of 2473 g falling between 75th and 90th  percentile for 34 weeks.  Normal amniotic fluid with AFI of 11.2.  Normal  cervical length of 4 cm on translabial scanning.   HOSPITAL COURSE:  1. Preterm uterine contractions.  The patient was admitted to antenatal and     placed on bed rest.  She was started on Unasyn 3 g IV q.6h for GBS.  She     was also  started on Procardia XL 3 mg p.o. b.i.d. The Procardia was     discontinued the day following admission.  The patient was monitored     while off the Procardia.  She continued to have contractions.  However,     her cervix was checked prior to discharge and there was no change in her     cervical exam.  Thus, she was discharged home with bed rest.  A 24-hour     was collected to help rule out pre-eclampsia.  The patient had 1200 mL of     total urine with 84 mg of protein in her urine for the 24 hours.  This     combined with a negative pre-eclampsia panel was reassuring that the     patient was not developing pre-eclampsia and she was instructed to     continue bed rest at home and to stay well hydrated.  In addition, she     was instructed to avoid any vigorous activity or have anything in her     vagina that would stimulate her uterus.   1. Gestational diabetes.  The patient's blood sugars were well controlled     throughout hospital course with diet control.  CBGs ranged between the 88     to 104 range with exhibited very good control.  Thus, there were no     changes made in the management of her gestational diabetes.   1. GBS positive.  As stated above,  the patient was given three full days of     Unasyn 3 g IV q.6h for treatment of her GBS status.  The patient     continued to contract despite this treatment for her GBS positive status.     Franchot Mimes, MD                         Javier Glazier. Okey Dupre, M.D.    TV/MEDQ  D:  05/21/2004  T:  05/22/2004  Job:  [id number]

## 2011-05-20 ENCOUNTER — Ambulatory Visit (HOSPITAL_COMMUNITY): Payer: Self-pay

## 2011-06-01 ENCOUNTER — Ambulatory Visit (HOSPITAL_COMMUNITY)
Admission: RE | Admit: 2011-06-01 | Discharge: 2011-06-01 | Disposition: A | Discharge status: Home / Self Care | Payer: Medicaid Other | Source: Ambulatory Visit | Attending: Family Medicine | Admitting: Family Medicine

## 2011-06-01 DIAGNOSIS — M7989 Other specified soft tissue disorders: Secondary | ICD-10-CM | POA: Insufficient documentation

## 2011-06-01 DIAGNOSIS — Z8249 Family history of ischemic heart disease and other diseases of the circulatory system: Secondary | ICD-10-CM | POA: Insufficient documentation

## 2011-06-01 DIAGNOSIS — I079 Rheumatic tricuspid valve disease, unspecified: Secondary | ICD-10-CM | POA: Insufficient documentation

## 2011-06-01 DIAGNOSIS — I10 Essential (primary) hypertension: Secondary | ICD-10-CM | POA: Insufficient documentation

## 2011-06-01 DIAGNOSIS — R002 Palpitations: Secondary | ICD-10-CM | POA: Insufficient documentation

## 2011-06-03 IMAGING — US US PELVIS COMPLETE MODIFY
1 series · 14 of 16 positions shown · non-contrast
Comparison: Pelvic ultrasound 09/03/2008.

CLINICAL DATA: Pain after IUD placement.

COMPLETE ULTRASOUND OF PELVIS
TECHNIQUE: Transabdominal and transvaginal ultrasound examination
of the pelvis was performed including evaluation of the uterus,
ovaries, adnexal regions, and pelvic cul-de-sac.

[Series 1: us pelvis complete modify · 0.22mm/px · 14 of 16 slices shown]
[im 1/16]
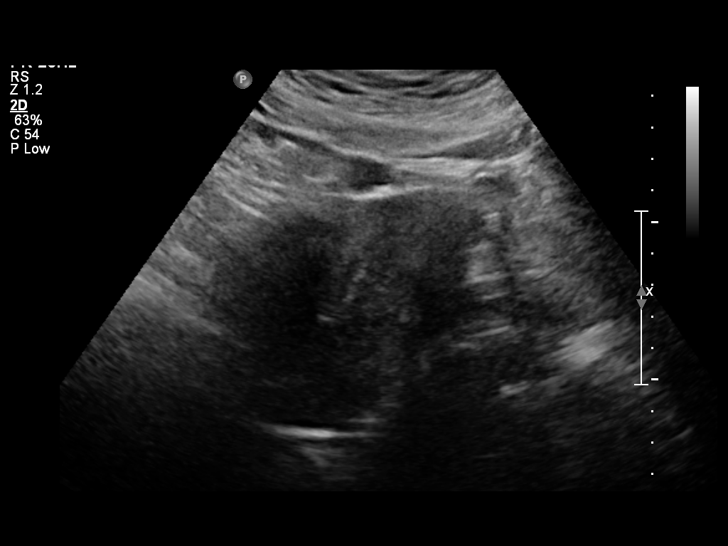
[im 2/16]
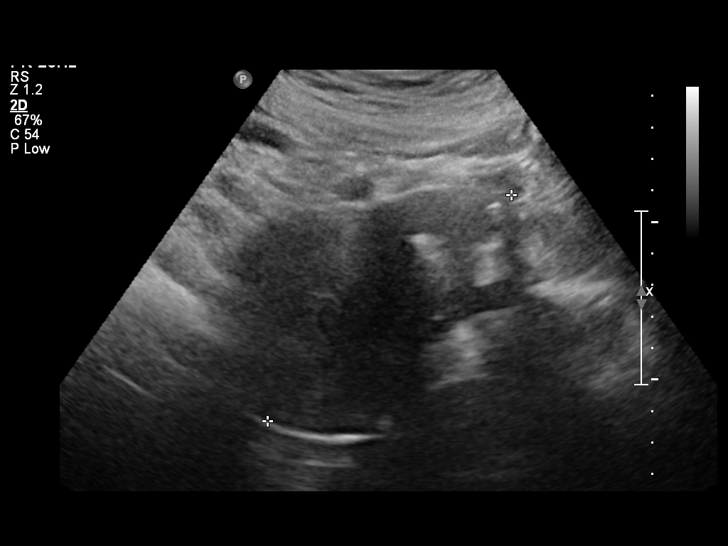
[im 3/16]
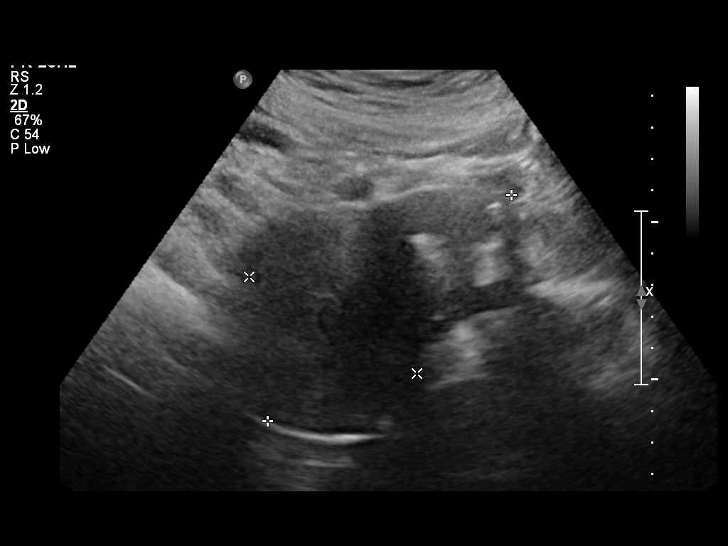
[im 5/16]
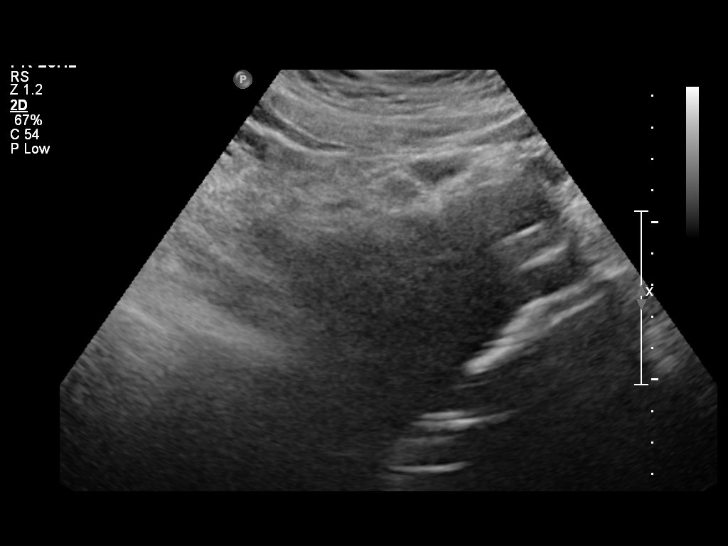
[im 6/16]
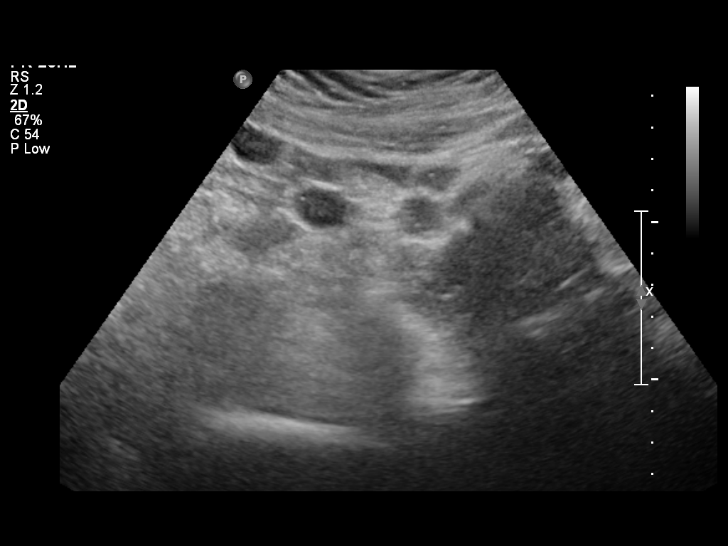
[im 7/16]
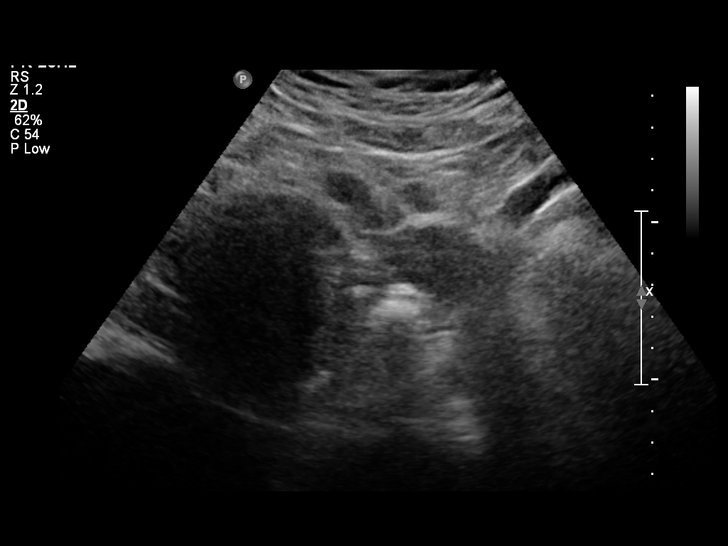
[im 8/16]
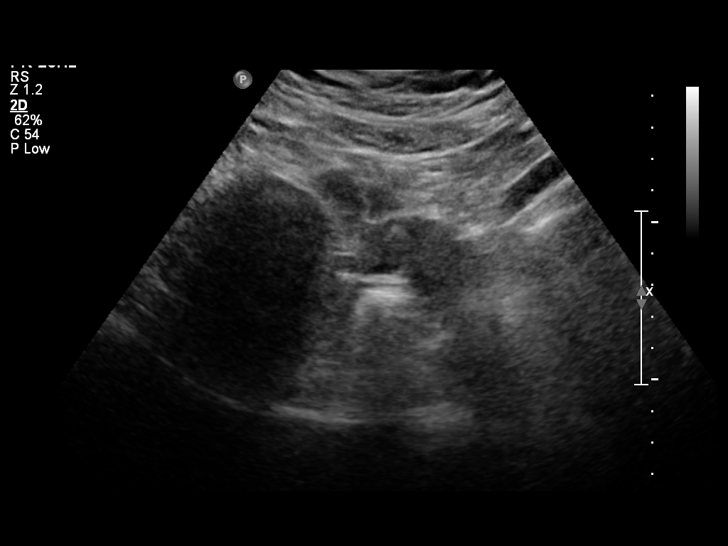
[im 9/16]
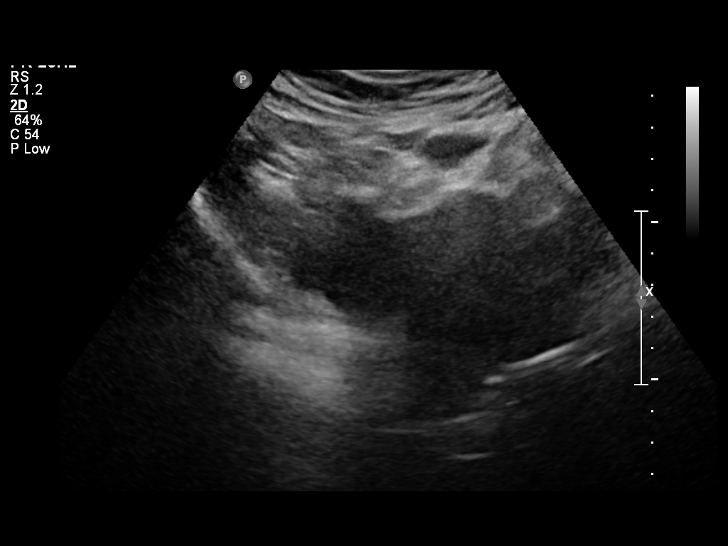
[im 10/16]
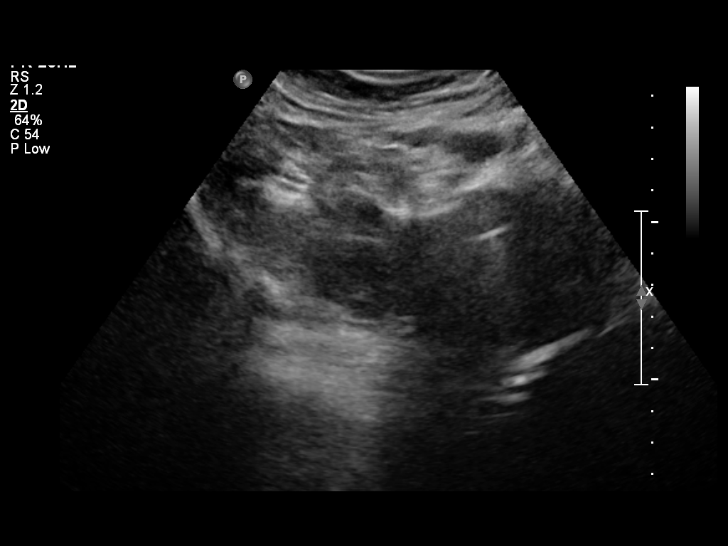
[im 11/16]
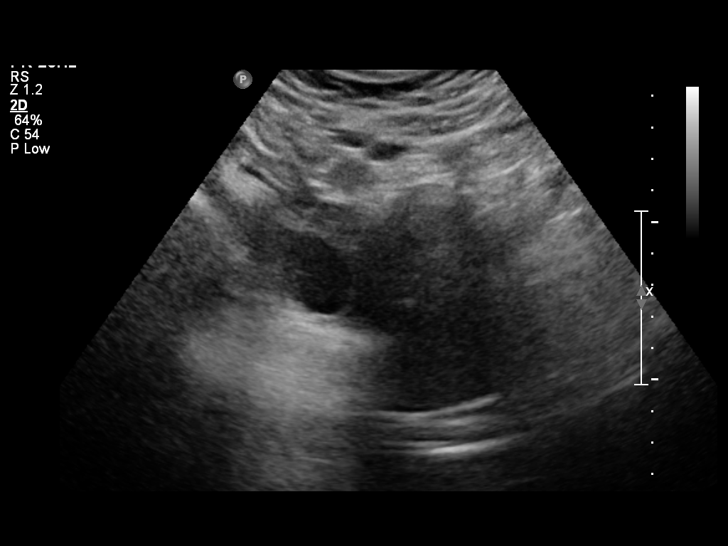
[im 13/16]
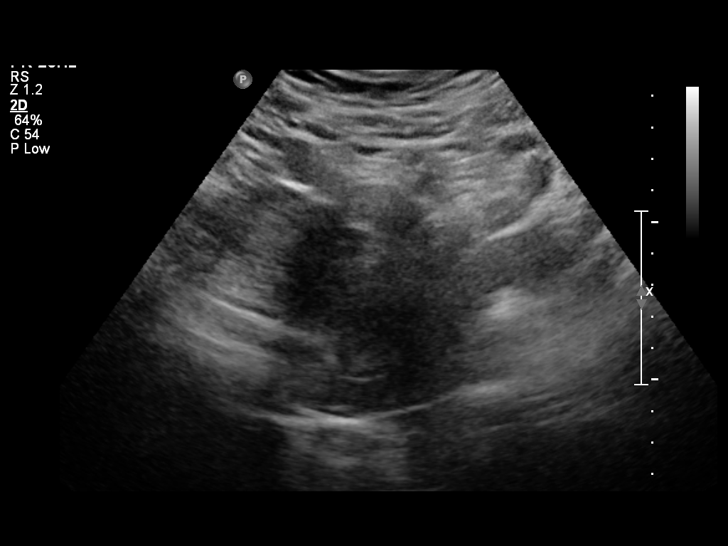
[im 14/16]
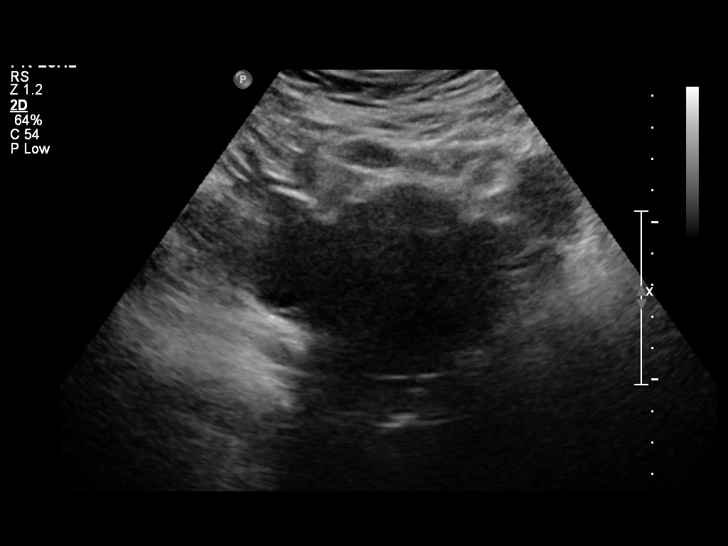
[im 15/16]
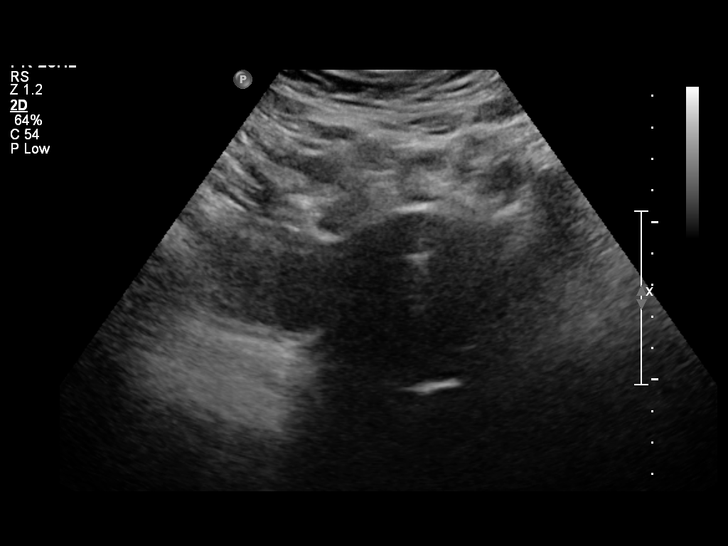
[im 16/16]
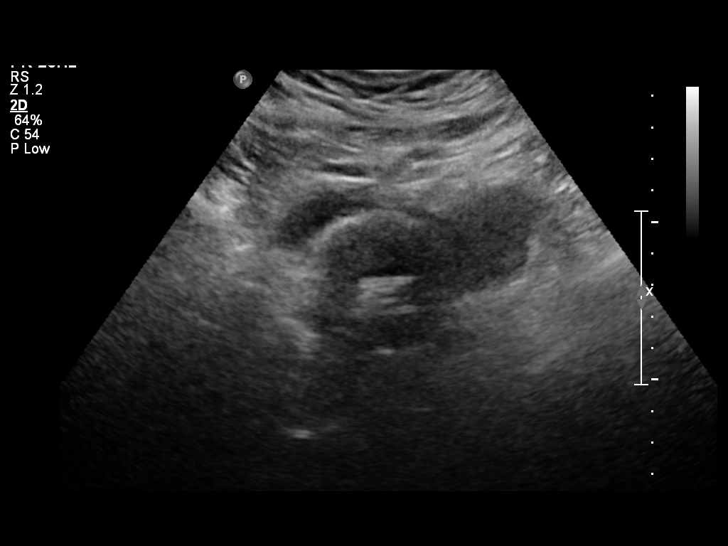

[14 of 16 positions shown; findings below may reference images not displayed]

FINDINGS: Uterus measures 10.5 x 6.1 x 6.1 cm.  Three fibroids are
identified.  The largest is submucosal and anterior measuring 3.5 x
2.7 x 3.0 cm.

Endometrium measures 9.3 mm.  IUD is in place.

Right Ovary Measures 3.9 x 3.2 x 3.0 cm unremarkable. There is a
simple cyst measuring 2.5 x 2.5 x 2.3 cm.

Left Ovary measures 2.6 x 1.7 x 3.1 cm and unremarkable.

Other Findings:  no free pelvic fluid.
IMPRESSION: IUD in place.  No acute finding with fibroids as above.

## 2011-09-06 LAB — POCT URINALYSIS DIP (DEVICE)
Bilirubin Urine: NEGATIVE
Glucose, UA: NEGATIVE
Hgb urine dipstick: NEGATIVE
Nitrite: NEGATIVE
Urobilinogen, UA: 1

## 2011-09-06 LAB — CBC
HCT: 35.3 — ABNORMAL LOW
Hemoglobin: 11.9 — ABNORMAL LOW
MCHC: 33.7
MCV: 85.3
Platelets: 183
RBC: 4.15
RDW: 14.1
WBC: 11.6 — ABNORMAL HIGH

## 2011-09-06 LAB — POCT PREGNANCY, URINE
Preg Test, Ur: NEGATIVE
Preg Test, Ur: NEGATIVE

## 2011-09-06 LAB — WET PREP, GENITAL

## 2011-09-06 LAB — GC/CHLAMYDIA PROBE AMP, GENITAL: Chlamydia, DNA Probe: NEGATIVE

## 2013-01-29 ENCOUNTER — Encounter (HOSPITAL_COMMUNITY): Payer: Self-pay | Admitting: Pharmacist

## 2013-02-08 ENCOUNTER — Encounter (HOSPITAL_COMMUNITY)
Admission: RE | Admit: 2013-02-08 | Discharge: 2013-02-08 | Disposition: A | Discharge status: Home / Self Care | Payer: BC Managed Care – PPO | Source: Ambulatory Visit | Attending: Obstetrics and Gynecology | Admitting: Obstetrics and Gynecology

## 2013-02-08 ENCOUNTER — Encounter (HOSPITAL_COMMUNITY): Payer: Self-pay

## 2013-02-08 DIAGNOSIS — Z01818 Encounter for other preprocedural examination: Secondary | ICD-10-CM | POA: Insufficient documentation

## 2013-02-08 DIAGNOSIS — Z01812 Encounter for preprocedural laboratory examination: Secondary | ICD-10-CM | POA: Insufficient documentation

## 2013-02-08 HISTORY — DX: Nontoxic single thyroid nodule: E04.1

## 2013-02-08 HISTORY — DX: Cardiac murmur, unspecified: R01.1

## 2013-02-08 HISTORY — DX: Essential (primary) hypertension: I10

## 2013-02-08 HISTORY — DX: Unspecified asthma, uncomplicated: J45.909

## 2013-02-08 HISTORY — DX: Headache: R51

## 2013-02-08 LAB — CBC
HCT: 35.2 % — ABNORMAL LOW (ref 36.0–46.0)
Platelets: 224 10*3/uL (ref 150–400)
RBC: 4.43 MIL/uL (ref 3.87–5.11)
RDW: 14.3 % (ref 11.5–15.5)
WBC: 8.8 10*3/uL (ref 4.0–10.5)

## 2013-02-08 LAB — SURGICAL PCR SCREEN: Staphylococcus aureus: NEGATIVE

## 2013-02-08 NOTE — Patient Instructions (Addendum)
Your procedure is scheduled on:02/13/13  Enter through the Main Entrance at :8am Pick up desk phone and dial [phone] and inform us of your arrival.  Please call [phone] if you have any problems the morning of surgery.  Remember: Do not eat or drink after midnight:Monday   Take these meds the morning of surgery with a sip of water:none  DO NOT wear jewelry, eye make-up, lipstick,body lotion, or dark fingernail polish. Do not shave for 48 hours prior to surgery.  If you are to be admitted after surgery, leave suitcase in car until your room has been assigned. Patients discharged on the day of surgery will not be allowed to drive home.

## 2013-02-12 ENCOUNTER — Encounter (HOSPITAL_COMMUNITY): Payer: Self-pay | Admitting: Obstetrics and Gynecology

## 2013-02-12 NOTE — H&P (Signed)
Carly Lynch is an 42 y.o. female. G6 [id number] with a history of uterine fibroids and heavy menses with now are interferring with the patient's quality of life. These have been followed for a number of years and she was offered several options of treatment including Depot Lupron therapy but at this point she desires definitive treatment with total abdominal hysterectomy and possible bilateral salpingo oophorectomy. Her cycles are heavy and she passes clots. Also she has pelvic pain.    Past Medical History  Diagnosis Date  . Heart murmur   . Thyroid nodule   . Hypertension     no meds- being monitored  . Headache     h/o migraines  . Asthma     has inhaler    Past Surgical History  Procedure Laterality Date  . Tonsillectomy    . Wisdom tooth extraction    . Cyst excision      x2 behind ear    No family history on file.  Social History:  reports that she has never smoked. She does not have any smokeless tobacco history on file. She reports that she does not drink alcohol. Her drug history is not on file.  Allergies:  Allergies  Allergen Reactions  . Ciprofloxin Hcl (Ciprofloxacin) Hives  . Percocet (Oxycodone-Acetaminophen) Hives    Codeine ok    No prescriptions prior to admission    Review of Systems  Constitutional: Negative.   HENT: Negative.   Respiratory: Negative.  Negative for cough and hemoptysis.   Cardiovascular: Negative.  Negative for chest pain, palpitations and orthopnea.  Gastrointestinal: Positive for abdominal pain. Negative for heartburn, nausea, vomiting, diarrhea, constipation and blood in stool.  Genitourinary: Negative for dysuria, urgency, frequency and hematuria.  Neurological: Negative for headaches.    There were no vitals taken for this visit. Physical Exam  Constitutional: She is oriented to person, place, and time. She appears well-developed and well-nourished.  HENT:  Head: Normocephalic and atraumatic.  Eyes: Conjunctivae and EOM  are normal. Pupils are equal, round, and reactive to light.  Neck: Normal range of motion. Neck supple. No thyromegaly present.  Cardiovascular: Normal rate, regular rhythm and normal heart sounds.   No murmur heard. Respiratory: Effort normal and breath sounds normal.  GI: Soft. Bowel sounds are normal. She exhibits no distension. There is no tenderness. There is no rebound and no guarding.  Musculoskeletal: Normal range of motion.  Neurological: She is alert and oriented to person, place, and time.  Skin: Skin is warm and dry.  Psychiatric: She has a normal mood and affect. Her behavior is normal. Thought content normal.   Pelvic Exam   External genitalia: within normal limits   BUS: normal without lesion   Vagina: with clots but no lesions   Cervix: without lesion non tender   Uterus: anterior 14 week size tender   Adnexa: no masses appreciated  No results found for this or any previous visit (from the past 24 hour(s)).  No results found.   Impression: Large uterine fibroids with menorrhagia, pelvic pain  Plan: Total abdominal hysterectomy and possible bilateral salpingo oophorectomy. I will conserve the ovaries per patient request if they are free of pathology. The risks of infection, hemorrhage, blood clot and injury to adjacent structures have been discussed.  ROSS,ALLAN 02/12/2013, 6:25 PM

## 2013-02-13 ENCOUNTER — Encounter (HOSPITAL_COMMUNITY)
Admission: RE | Disposition: A | Discharge status: Home / Self Care | Payer: Self-pay | Source: Ambulatory Visit | Attending: Obstetrics and Gynecology

## 2013-02-13 ENCOUNTER — Encounter (HOSPITAL_COMMUNITY): Payer: Self-pay | Admitting: *Deleted

## 2013-02-13 ENCOUNTER — Inpatient Hospital Stay (HOSPITAL_COMMUNITY): Payer: BC Managed Care – PPO | Admitting: Anesthesiology

## 2013-02-13 ENCOUNTER — Encounter (HOSPITAL_COMMUNITY): Payer: Self-pay | Admitting: Anesthesiology

## 2013-02-13 ENCOUNTER — Inpatient Hospital Stay (HOSPITAL_COMMUNITY)
Admission: RE | Admit: 2013-02-13 | Discharge: 2013-02-15 | DRG: 359 | Disposition: A | Discharge status: Home / Self Care | Payer: BC Managed Care – PPO | Source: Ambulatory Visit | Attending: Obstetrics and Gynecology | Admitting: Obstetrics and Gynecology

## 2013-02-13 DIAGNOSIS — N949 Unspecified condition associated with female genital organs and menstrual cycle: Secondary | ICD-10-CM | POA: Diagnosis present

## 2013-02-13 DIAGNOSIS — D252 Subserosal leiomyoma of uterus: Principal | ICD-10-CM | POA: Diagnosis present

## 2013-02-13 DIAGNOSIS — D251 Intramural leiomyoma of uterus: Secondary | ICD-10-CM | POA: Diagnosis present

## 2013-02-13 DIAGNOSIS — I10 Essential (primary) hypertension: Secondary | ICD-10-CM | POA: Diagnosis present

## 2013-02-13 DIAGNOSIS — D219 Benign neoplasm of connective and other soft tissue, unspecified: Secondary | ICD-10-CM

## 2013-02-13 DIAGNOSIS — N920 Excessive and frequent menstruation with regular cycle: Secondary | ICD-10-CM | POA: Diagnosis present

## 2013-02-13 DIAGNOSIS — R011 Cardiac murmur, unspecified: Secondary | ICD-10-CM | POA: Diagnosis present

## 2013-02-13 HISTORY — PX: ABDOMINAL HYSTERECTOMY: SHX81

## 2013-02-13 LAB — URINALYSIS, ROUTINE W REFLEX MICROSCOPIC
Bilirubin Urine: NEGATIVE
Nitrite: NEGATIVE
Protein, ur: NEGATIVE mg/dL
Specific Gravity, Urine: 1.015 (ref 1.005–1.030)
Urobilinogen, UA: 0.2 mg/dL (ref 0.0–1.0)

## 2013-02-13 LAB — URINE MICROSCOPIC-ADD ON

## 2013-02-13 LAB — APTT: aPTT: 31 seconds (ref 24–37)

## 2013-02-13 LAB — COMPREHENSIVE METABOLIC PANEL
AST: 17 U/L (ref 0–37)
Albumin: 3.9 g/dL (ref 3.5–5.2)
Alkaline Phosphatase: 62 U/L (ref 39–117)
CO2: 27 mEq/L (ref 19–32)
Chloride: 101 mEq/L (ref 96–112)
GFR calc non Af Amer: >90 mL/min (ref >90)
Potassium: 3.4 mEq/L — ABNORMAL LOW (ref 3.5–5.1)
Total Bilirubin: 0.5 mg/dL (ref 0.3–1.2)

## 2013-02-13 LAB — PREGNANCY, URINE: Preg Test, Ur: NEGATIVE

## 2013-02-13 SURGERY — HYSTERECTOMY, ABDOMINAL
Anesthesia: General | Site: Abdomen | Wound class: Clean Contaminated

## 2013-02-13 MED ORDER — DIPHENHYDRAMINE HCL 25 MG PO CAPS
25.0000 mg | ORAL_CAPSULE | ORAL | Status: DC | PRN
  Administered 2013-02-14: 25 mg via ORAL
  Filled 2013-02-13 (×2): qty 1

## 2013-02-13 MED ORDER — SCOPOLAMINE 1 MG/3DAYS TD PT72
1.0000 | MEDICATED_PATCH | TRANSDERMAL | Status: DC
  Administered 2013-02-13: 1.5 mg via TRANSDERMAL

## 2013-02-13 MED ORDER — PNEUMOCOCCAL VAC POLYVALENT 25 MCG/0.5ML IJ INJ
0.5000 mL | INJECTION | INTRAMUSCULAR | Status: AC
  Administered 2013-02-14: 0.5 mL via INTRAMUSCULAR
  Filled 2013-02-13: qty 0.5

## 2013-02-13 MED ORDER — SODIUM CHLORIDE 0.9 % IJ SOLN: 9.0000 mL | INTRAMUSCULAR | Status: DC | PRN

## 2013-02-13 MED ORDER — NALOXONE HCL 1 MG/ML IJ SOLN
1.0000 ug/kg/h | INTRAVENOUS | Status: DC | PRN
  Filled 2013-02-13: qty 2

## 2013-02-13 MED ORDER — METOCLOPRAMIDE HCL 5 MG/ML IJ SOLN: 10.0000 mg | Freq: Once | INTRAMUSCULAR | Status: DC | PRN

## 2013-02-13 MED ORDER — ONDANSETRON HCL 4 MG/2ML IJ SOLN
4.0000 mg | Freq: Four times a day (QID) | INTRAMUSCULAR | Status: DC | PRN
  Administered 2013-02-13: 4 mg via INTRAVENOUS
  Filled 2013-02-13: qty 2

## 2013-02-13 MED ORDER — ONDANSETRON HCL 4 MG/2ML IJ SOLN
INTRAMUSCULAR | Status: AC
  Filled 2013-02-13: qty 2

## 2013-02-13 MED ORDER — FENTANYL CITRATE 0.05 MG/ML IJ SOLN
INTRAMUSCULAR | Status: AC
  Filled 2013-02-13: qty 2

## 2013-02-13 MED ORDER — POTASSIUM CHLORIDE 2 MEQ/ML IV SOLN
INTRAVENOUS | Status: DC
  Administered 2013-02-13 – 2013-02-14 (×3): via INTRAVENOUS
  Filled 2013-02-13 (×6): qty 1000

## 2013-02-13 MED ORDER — 0.9 % SODIUM CHLORIDE (POUR BTL) OPTIME
TOPICAL | Status: DC | PRN
  Administered 2013-02-13 (×2): 1000 mL

## 2013-02-13 MED ORDER — DIPHENHYDRAMINE HCL 50 MG/ML IJ SOLN: 12.5000 mg | INTRAMUSCULAR | Status: DC | PRN

## 2013-02-13 MED ORDER — LACTATED RINGERS IV SOLN
INTRAVENOUS | Status: DC
  Administered 2013-02-13 (×3): via INTRAVENOUS

## 2013-02-13 MED ORDER — GLYCOPYRROLATE 0.2 MG/ML IJ SOLN
INTRAMUSCULAR | Status: AC
  Filled 2013-02-13: qty 1

## 2013-02-13 MED ORDER — MIDAZOLAM HCL 2 MG/2ML IJ SOLN
INTRAMUSCULAR | Status: AC
  Filled 2013-02-13: qty 2

## 2013-02-13 MED ORDER — FENTANYL CITRATE 0.05 MG/ML IJ SOLN
INTRAMUSCULAR | Status: AC
Start: 2013-02-13 — End: 2013-02-13
  Administered 2013-02-13: 50 ug via INTRAVENOUS
  Filled 2013-02-13: qty 2

## 2013-02-13 MED ORDER — NALBUPHINE SYRINGE 5 MG/0.5 ML
5.0000 mg | INJECTION | INTRAMUSCULAR | Status: DC | PRN
  Filled 2013-02-13: qty 1

## 2013-02-13 MED ORDER — FENTANYL CITRATE 0.05 MG/ML IJ SOLN
INTRAMUSCULAR | Status: AC
  Administered 2013-02-13: 50 ug via INTRAVENOUS
  Filled 2013-02-13: qty 2

## 2013-02-13 MED ORDER — LIDOCAINE-EPINEPHRINE (PF) 2 %-1:200000 IJ SOLN
INTRAMUSCULAR | Status: AC
  Filled 2013-02-13: qty 20

## 2013-02-13 MED ORDER — DIPHENHYDRAMINE HCL 12.5 MG/5ML PO ELIX: 12.5000 mg | ORAL_SOLUTION | Freq: Four times a day (QID) | ORAL | Status: DC | PRN

## 2013-02-13 MED ORDER — SCOPOLAMINE 1 MG/3DAYS TD PT72: 1.0000 | MEDICATED_PATCH | TRANSDERMAL | Status: DC

## 2013-02-13 MED ORDER — SODIUM BICARBONATE 8.4 % IV SOLN
INTRAVENOUS | Status: DC | PRN
  Administered 2013-02-13: 3 mL via EPIDURAL

## 2013-02-13 MED ORDER — DEXAMETHASONE SODIUM PHOSPHATE 10 MG/ML IJ SOLN
INTRAMUSCULAR | Status: AC
  Filled 2013-02-13: qty 1

## 2013-02-13 MED ORDER — DIPHENHYDRAMINE HCL 50 MG/ML IJ SOLN: 25.0000 mg | INTRAMUSCULAR | Status: DC | PRN

## 2013-02-13 MED ORDER — SCOPOLAMINE 1 MG/3DAYS TD PT72
MEDICATED_PATCH | TRANSDERMAL | Status: AC
  Filled 2013-02-13: qty 1

## 2013-02-13 MED ORDER — PROPOFOL INFUSION 10 MG/ML OPTIME
INTRAVENOUS | Status: DC | PRN
  Administered 2013-02-13: 25 ug/kg/min via INTRAVENOUS

## 2013-02-13 MED ORDER — MEPERIDINE HCL 25 MG/ML IJ SOLN: 6.2500 mg | INTRAMUSCULAR | Status: DC | PRN

## 2013-02-13 MED ORDER — MIDAZOLAM HCL 5 MG/5ML IJ SOLN
INTRAMUSCULAR | Status: DC | PRN
  Administered 2013-02-13 (×2): 1 mg via INTRAVENOUS
  Administered 2013-02-13: 2 mg via INTRAVENOUS

## 2013-02-13 MED ORDER — BUTORPHANOL TARTRATE 1 MG/ML IJ SOLN: 1.0000 mg | INTRAMUSCULAR | Status: DC | PRN

## 2013-02-13 MED ORDER — ONDANSETRON HCL 4 MG/2ML IJ SOLN: 4.0000 mg | Freq: Three times a day (TID) | INTRAMUSCULAR | Status: DC | PRN

## 2013-02-13 MED ORDER — NALOXONE HCL 0.4 MG/ML IJ SOLN: 0.4000 mg | INTRAMUSCULAR | Status: DC | PRN

## 2013-02-13 MED ORDER — SCOPOLAMINE 1 MG/3DAYS TD PT72
MEDICATED_PATCH | TRANSDERMAL | Status: AC
  Administered 2013-02-13: 1.5 mg via TRANSDERMAL
  Filled 2013-02-13: qty 1

## 2013-02-13 MED ORDER — ALBUTEROL SULFATE HFA 108 (90 BASE) MCG/ACT IN AERS
1.0000 | INHALATION_SPRAY | Freq: Four times a day (QID) | RESPIRATORY_TRACT | Status: DC
  Administered 2013-02-13 – 2013-02-15 (×5): 1 via RESPIRATORY_TRACT
  Filled 2013-02-13: qty 6.7

## 2013-02-13 MED ORDER — BUPIVACAINE IN DEXTROSE 0.75-8.25 % IT SOLN
INTRATHECAL | Status: DC | PRN
  Administered 2013-02-13: 1.8 mL via INTRATHECAL

## 2013-02-13 MED ORDER — PROPOFOL 10 MG/ML IV EMUL
INTRAVENOUS | Status: AC
  Filled 2013-02-13: qty 20

## 2013-02-13 MED ORDER — SODIUM BICARBONATE 8.4 % IV SOLN
INTRAVENOUS | Status: AC
  Filled 2013-02-13: qty 50

## 2013-02-13 MED ORDER — MENTHOL 3 MG MT LOZG
1.0000 | LOZENGE | OROMUCOSAL | Status: DC | PRN
  Administered 2013-02-13: 3 mg via ORAL
  Filled 2013-02-13: qty 9

## 2013-02-13 MED ORDER — CEFAZOLIN SODIUM-DEXTROSE 2-3 GM-% IV SOLR
2.0000 g | INTRAVENOUS | Status: AC
  Administered 2013-02-13: 2 g via INTRAVENOUS

## 2013-02-13 MED ORDER — NEOSTIGMINE METHYLSULFATE 1 MG/ML IJ SOLN
INTRAMUSCULAR | Status: AC
  Filled 2013-02-13: qty 1

## 2013-02-13 MED ORDER — FENTANYL CITRATE 0.05 MG/ML IJ SOLN: 25.0000 ug | INTRAMUSCULAR | Status: DC | PRN

## 2013-02-13 MED ORDER — HYDROMORPHONE 0.3 MG/ML IV SOLN
INTRAVENOUS | Status: DC
  Administered 2013-02-13: 0.2 mg via INTRAVENOUS
  Administered 2013-02-13: 14:00:00 via INTRAVENOUS
  Administered 2013-02-14: 0.2 mg via INTRAVENOUS
  Filled 2013-02-13: qty 25

## 2013-02-13 MED ORDER — FENTANYL CITRATE 0.05 MG/ML IJ SOLN
INTRAMUSCULAR | Status: DC | PRN
  Administered 2013-02-13: 25 ug via INTRATHECAL
  Administered 2013-02-13 (×2): 50 ug via INTRAVENOUS

## 2013-02-13 MED ORDER — MORPHINE SULFATE (PF) 0.5 MG/ML IJ SOLN
INTRAMUSCULAR | Status: DC | PRN
  Administered 2013-02-13: .15 mg via INTRATHECAL

## 2013-02-13 MED ORDER — MORPHINE SULFATE 0.5 MG/ML IJ SOLN
INTRAMUSCULAR | Status: AC
  Filled 2013-02-13: qty 10

## 2013-02-13 MED ORDER — IBUPROFEN 600 MG PO TABS
600.0000 mg | ORAL_TABLET | Freq: Four times a day (QID) | ORAL | Status: DC | PRN
  Administered 2013-02-14 – 2013-02-15 (×3): 600 mg via ORAL
  Filled 2013-02-13 (×3): qty 1

## 2013-02-13 MED ORDER — METOCLOPRAMIDE HCL 5 MG/ML IJ SOLN
10.0000 mg | Freq: Three times a day (TID) | INTRAMUSCULAR | Status: DC | PRN
  Administered 2013-02-13: 10 mg via INTRAVENOUS
  Filled 2013-02-13: qty 2

## 2013-02-13 MED ORDER — FENTANYL CITRATE 0.05 MG/ML IJ SOLN
25.0000 ug | INTRAMUSCULAR | Status: DC | PRN
  Administered 2013-02-13 (×2): 50 ug via INTRAVENOUS

## 2013-02-13 MED ORDER — LIDOCAINE HCL (CARDIAC) 20 MG/ML IV SOLN
INTRAVENOUS | Status: AC
  Filled 2013-02-13: qty 5

## 2013-02-13 MED ORDER — FENTANYL CITRATE 0.05 MG/ML IJ SOLN
INTRAMUSCULAR | Status: AC
  Filled 2013-02-13: qty 5

## 2013-02-13 MED ORDER — INFLUENZA VIRUS VACC SPLIT PF IM SUSP
0.5000 mL | INTRAMUSCULAR | Status: AC
  Administered 2013-02-14: 0.5 mL via INTRAMUSCULAR
  Filled 2013-02-13: qty 0.5

## 2013-02-13 MED ORDER — DIPHENHYDRAMINE HCL 50 MG/ML IJ SOLN: 12.5000 mg | Freq: Four times a day (QID) | INTRAMUSCULAR | Status: DC | PRN

## 2013-02-13 SURGICAL SUPPLY — 32 items
BENZOIN TINCTURE PRP APPL 2/3 (GAUZE/BANDAGES/DRESSINGS) ×3 IMPLANT
CANISTER SUCTION 2500CC (MISCELLANEOUS) ×3 IMPLANT
CLOTH BEACON ORANGE TIMEOUT ST (SAFETY) ×3 IMPLANT
CONT PATH 16OZ SNAP LID 3702 (MISCELLANEOUS) ×3 IMPLANT
DECANTER SPIKE VIAL GLASS SM (MISCELLANEOUS) IMPLANT
DRSG OPSITE POSTOP 4X10 (GAUZE/BANDAGES/DRESSINGS) ×6 IMPLANT
GLOVE BIO SURGEON STRL SZ7.5 (GLOVE) ×3 IMPLANT
GOWN PREVENTION PLUS LG XLONG (DISPOSABLE) ×6 IMPLANT
GOWN PREVENTION PLUS XXLARGE (GOWN DISPOSABLE) ×3 IMPLANT
NEEDLE HYPO 25X1 1.5 SAFETY (NEEDLE) IMPLANT
NS IRRIG 1000ML POUR BTL (IV SOLUTION) ×3 IMPLANT
PACK ABDOMINAL GYN (CUSTOM PROCEDURE TRAY) ×3 IMPLANT
PAD OB MATERNITY 4.3X12.25 (PERSONAL CARE ITEMS) ×3 IMPLANT
PROTECTOR NERVE ULNAR (MISCELLANEOUS) ×3 IMPLANT
SPONGE LAP 18X18 X RAY DECT (DISPOSABLE) ×6 IMPLANT
STAPLER VISISTAT 35W (STAPLE) IMPLANT
STRIP CLOSURE SKIN 1/2X4 (GAUZE/BANDAGES/DRESSINGS) ×3 IMPLANT
SUT PLAIN 2 0 XLH (SUTURE) ×3 IMPLANT
SUT VIC AB 0 CT1 18XCR BRD8 (SUTURE) ×4 IMPLANT
SUT VIC AB 0 CT1 27 (SUTURE) ×3
SUT VIC AB 0 CT1 27XBRD ANBCTR (SUTURE) ×6 IMPLANT
SUT VIC AB 0 CT1 8-18 (SUTURE) ×2
SUT VIC AB 2-0 CT1 27 (SUTURE) ×2
SUT VIC AB 2-0 CT1 TAPERPNT 27 (SUTURE) ×4 IMPLANT
SUT VIC AB 2-0 SH 27 (SUTURE)
SUT VIC AB 2-0 SH 27XBRD (SUTURE) IMPLANT
SUT VIC AB 4-0 PS2 27 (SUTURE) ×3 IMPLANT
SUT VICRYL 0 TIES 12 18 (SUTURE) ×3 IMPLANT
SYR CONTROL 10ML LL (SYRINGE) IMPLANT
TOWEL OR 17X24 6PK STRL BLUE (TOWEL DISPOSABLE) ×6 IMPLANT
TRAY FOLEY CATH 14FR (SET/KITS/TRAYS/PACK) ×3 IMPLANT
WATER STERILE IRR 1000ML POUR (IV SOLUTION) IMPLANT

## 2013-02-13 NOTE — Anesthesia Postprocedure Evaluation (Signed)
  Anesthesia Post-op Note  Patient: Carly Lynch  Procedure(s) Performed: Procedure(s): HYSTERECTOMY ABDOMINAL (N/A)   Patient is awake, responsive, moving her legs, and has signs of resolution of her numbness. Pain and nausea are reasonably well controlled. Vital signs are stable and clinically acceptable. Oxygen saturation is clinically acceptable. There are no apparent anesthetic complications at this time. Patient is ready for discharge.

## 2013-02-13 NOTE — Anesthesia Postprocedure Evaluation (Signed)
  Anesthesia Post-op Note  Patient: Carly Lynch  Procedure(s) Performed: Procedure(s): HYSTERECTOMY ABDOMINAL (N/A)  Patient Location:   Anesthesia Type:General  Level of Consciousness: awake, alert , oriented and patient cooperative  Airway and Oxygen Therapy: Patient Spontanous Breathing and Patient connected to nasal cannula oxygen  Post-op Pain: mild  Post-op Assessment: Patient's Cardiovascular Status Stable, Respiratory Function Stable, Patent Airway, No signs of Nausea or vomiting, Adequate PO intake and Pain level controlled  Post-op Vital Signs: Reviewed and stable  Complications: No apparent anesthesia complications

## 2013-02-13 NOTE — H&P (Signed)
  Status unchanged will proceed with planned procedure. 

## 2013-02-13 NOTE — Transfer of Care (Signed)
Immediate Anesthesia Transfer of Care Note  Patient: Carly Lynch  Procedure(s) Performed: Procedure(s): HYSTERECTOMY ABDOMINAL (N/A)  Patient Location: PACU  Anesthesia Type:Spinal and Epidural  Level of Consciousness: awake and sedated  Airway & Oxygen Therapy: Patient Spontanous Breathing and Patient connected to nasal cannula oxygen  Post-op Assessment: Report given to PACU RN and Post -op Vital signs reviewed and stable  Post vital signs: Reviewed and stable  Complications: No apparent anesthesia complications

## 2013-02-13 NOTE — Brief Op Note (Signed)
02/13/2013  10:37 AM  PATIENT:  Carly Lynch  42 y.o. female  PRE-OPERATIVE DIAGNOSIS:  chronic pelvic pain  POST-OPERATIVE DIAGNOSIS:  chronic pelvic pain  PROCEDURE:  Procedure(s): HYSTERECTOMY ABDOMINAL (N/A)  SURGEON:  Surgeon(s) and Role:    * Miguel Aschoff, MD - Primary    * W Lodema Hong, MD - Assisting  ANESTHESIA:   epidural  EBL:  Total I/O In: 1000 [I.V.:1000] Out: 275 [Urine:150; Blood:125]  BLOOD ADMINISTERED:none  DRAINS: Urinary Catheter (Foley)   LOCAL MEDICATIONS USED:  NONE  SPECIMEN:  Source of Specimen:  Uterus and cervix  DISPOSITION OF SPECIMEN:  PATHOLOGY  COUNTS:  YES  TOURNIQUET:  * No tourniquets in log *  DICTATION: .Note written in EPIC and Other Dictation: Dictation Number Dictation [id number]  PLAN OF CARE: Admit to inpatient   PATIENT DISPOSITION:  PACU - hemodynamically stable.   Delay start of Pharmacological VTE agent (>24hrs) due to surgical blood loss or risk of bleeding: SCD devices placed

## 2013-02-13 NOTE — Anesthesia Preprocedure Evaluation (Addendum)
Anesthesia Evaluation    Airway Mallampati: III TM Distance: >3 FB Neck ROM: Full    Dental no notable dental hx. (+) Teeth Intact   Pulmonary asthma ,  Snores breath sounds clear to auscultation  Pulmonary exam normal       Cardiovascular hypertension, negative cardio ROS  + Valvular Problems/Murmurs Rhythm:Regular Rate:Normal     Neuro/Psych  Headaches, negative psych ROS   GI/Hepatic negative GI ROS, Neg liver ROS,   Endo/Other  Goiter  Renal/GU negative Renal ROS  negative genitourinary   Musculoskeletal negative musculoskeletal ROS (+)   Abdominal   Peds  Hematology negative hematology ROS (+)   Anesthesia Other Findings Neck: Large mass thyroid  Reproductive/Obstetrics Chronic Pelvic Pain                            Anesthesia Physical Anesthesia Plan  ASA: II  Anesthesia Plan: Spinal, Combined Spinal and Epidural and Epidural   Post-op Pain Management:    Induction:   Airway Management Planned: Oral ETT and Natural Airway  Additional Equipment:   Intra-op Plan:   Post-operative Plan: Extubation in OR  Informed Consent:   Plan Discussed with: CRNA, Anesthesiologist and Surgeon  Anesthesia Plan Comments:        Anesthesia Quick Evaluation

## 2013-02-13 NOTE — Anesthesia Procedure Notes (Addendum)
Anesthesia Regional Block:   Narrative:    Spinal  Patient location during procedure: OR Start time: 02/13/2013 9:22 AM Staffing Anesthesiologist: FOSTER, MICHAEL A. Performed by: anesthesiologist  Preanesthetic Checklist Completed: patient identified, site marked, surgical consent, pre-op evaluation, timeout performed, IV checked, risks and benefits discussed and monitors and equipment checked Spinal Block Patient position: sitting Prep: site prepped and draped and DuraPrep Patient monitoring: cardiac monitor, continuous pulse ox, blood pressure and heart rate Approach: midline Location: L3-4 Injection technique: catheter Needle Needle type: Tuohy and Sprotte  Needle gauge: 24 G Needle length: 12.7 cm Needle insertion depth: 6 cm Catheter type: closed end flexible Catheter size: 19 g Assessment Sensory level: T4 Additional Notes CSE performed. 17ga Touhy LOR with air. 24ga Sprotte through the Touhy needle. CSF clear, free flow, no heme, no paresthesias. Bupivacaine, Duramorph and Fentanyl injected. Spinal needle withdrawn and 19ga epidural catheter threaded through the Touhy. Sterile dressing applied. Patient placed supine. Adequate sensory level obtained.

## 2013-02-14 ENCOUNTER — Encounter (HOSPITAL_COMMUNITY): Payer: Self-pay | Admitting: Obstetrics and Gynecology

## 2013-02-14 LAB — CBC
HCT: 31.7 % — ABNORMAL LOW (ref 36.0–46.0)
Hemoglobin: 11.3 g/dL — ABNORMAL LOW (ref 12.0–15.0)
MCH: 28.2 pg (ref 26.0–34.0)
MCHC: 35.6 g/dL (ref 30.0–36.0)
RDW: 14 % (ref 11.5–15.5)

## 2013-02-14 MED ORDER — HYDROMORPHONE HCL 2 MG PO TABS
2.0000 mg | ORAL_TABLET | ORAL | Status: DC | PRN
  Administered 2013-02-14 – 2013-02-15 (×4): 2 mg via ORAL
  Filled 2013-02-14 (×4): qty 1

## 2013-02-14 MED ORDER — BUTORPHANOL TARTRATE 10 MG/ML NA SOLN
1.0000 | NASAL | Status: DC | PRN
  Filled 2013-02-14: qty 2.5

## 2013-02-14 NOTE — Progress Notes (Signed)
Patient ID: Carly Lynch, female   DOB: Sep 19, 1971, 41 y.o.   MRN: [id number]  POD #1  Doing well this AM has had good pain control with Epidural narcotic.  O: Afebrile  BP 163/73   Pulse 81  Abdomen is soft and incision is clean and dry  Lab: Post op hemoglobin 11.3  A: Stable S/P abdominal hysterectomy.  Plan: D.C foley          Advance diet          Ambulate.          Can use stadol nasal spray prn pain due to Percocet allergy.

## 2013-02-14 NOTE — Op Note (Signed)
Carly Lynch, PINSON NO.:  [id number]  MEDICAL RECORD NO.:  [id number]  LOCATION:  9303                          FACILITY:  [facility]  PHYSICIAN:  Miguel Aschoff, M.D.       DATE OF BIRTH:  25-Jun-1971  DATE OF PROCEDURE:  02/13/2013 DATE OF DISCHARGE:                              OPERATIVE REPORT   PREOPERATIVE DIAGNOSES:  Uterine fibroids, pelvic pain, menorrhagia.  POSTOPERATIVE DIAGNOSIS:  Uterine fibroids, pelvic pain, menorrhagia.  PROCEDURE:  Total abdominal hysterectomy.  SURGEON:  Miguel Aschoff, M.D.  ASSIST:  Dr. Lodema Hong.  ANESTHESIA:  General.  ANESTHESIA:  Epidural and spinal.  COMPLICATIONS:  None.  JUSTIFICATION:  The patient is a 42 year old black female with a history of menorrhagia, pelvic pain, and significant uterine fibroids approximately 14 weeks equivalent size.  Because of the symptoms associated with these fibroids, menorrhagia, and pelvic pain.  The patient has requested to have the procedure be carried out to eliminate these tumors and resolve the menorrhagia and pelvic pain.  She is now being admitted to the hospital to undergo total abdominal hysterectomy. The risks and benefits of procedure have been discussed with the patient.  Informed consent has been obtained.  PROCEDURE IN DETAIL:  The patient was taken to the operating room, placed in supine position.  After a combination epidural and spinal anesthetic had been placed.  This was done without complication.  In the supine position, she was prepped and draped in the usual sterile fashion, after satisfactory level of anesthesia was achieved.  Once this was done, a Foley catheter was inserted.  Then, a Pfannenstiel incision was made extending down through the subcutaneous tissue with bleeding points being clamped and coagulated as they were accounted.  The fascia was then identified and incised transversely and separated from the underlying rectus muscles.  Rectus muscles were  divided in the midline. The peritoneum was then found and entered carefully avoiding underlying structures.  Once this was done, a self-retaining retractor was placed through the wound and the viscera packed out of the pelvis and inspection was then made of the pelvic organs.  The uterus was noted be enlarged at approximately 14 weeks equivalent size with fibroids, there was marked venous congestion in the pelvis.  The ovaries were within normal limits.  No adhesions were noted about the ovaries.  There was no sign of endometriosis.  At this point, the round ligaments were identified and suture ligated, and a bladder flap was created anteriorly without difficulty.  Perforations were then made below the utero-ovarian ligaments, and then using curved Heaney clamps, the utero-ovarian ligament and fallopian tube were clamped, cut, and suture ligated first with suture ligatures of 0 Vicryl and then free ties of 0 Vicryl.  Once this was done, the broad ligament was skeletonized.  The bladder flap further created and taken down below the cervix.  Uterine vessels were then found, clamped with curved Heaney clamps.  These pedicles were cut and suture ligated using suture ligatures of 0 Vicryl.  Then in order to allow visualization to the pelvis and the residual cervix, the fundus was then cut free from the cervix and passed off the operating field.  This left the cervix to be removed.  The cervix was grasped with Kocher clamps and elevated.  Then, using straight Heaney clamps, the paracervical fascia was clamped, cut, and suture ligated using suture ligatures of 0 Vicryl.  Then, the cardinal ligaments and uterosacral ligaments were clamped, cut, and suture ligated in a similar fashion all with curved Heaney clamps and suture ligatures of 0 Vicryl.  It was then possible to clamp across the cervix using curved Heaney clamps, and then the specimen was excised.  The specimen consisting of the cervix  and endocervix.  The angles of the vaginal cuff was then ligated using figure-of-eight sutures of 0 Vicryl.  The uterosacral ligaments were incorporated to support the vagina.  Then, the vaginal cuff was closed using running interlocking 0 Vicryl suture.  The pelvis was irrigated with warm saline, appeared to be excellent hemostasis.  After this was done, using 2-0 Vicryl suture the peritoneum of the pelvis was closed putting pedicles in retroperitoneal position.  A final inspection was made for hemostasis.  Hemostasis was excellent.  Lap counts were taken and found to be correct and at this point, the abdomen was closed.  The parietal peritoneum was closed using running interlocking 0 Vicryl suture.  The fascia was closed using 2 sutures of 0 Vicryl each starting at the lateral vaginal angles and meeting in the midline.  The subcutaneous tissue was closed using interrupted 2-0 plain sutures and the skin incision was closed using subcuticular 4-0 Vicryl.  The estimated blood loss from the procedure was approximately 120 mL.  The patient tolerated the procedure well and went to the recovery room in satisfactory condition.     Miguel Aschoff, M.D.     AR/MEDQ  D:  02/13/2013  T:  02/14/2013  Job:  [id number]

## 2013-02-15 MED ORDER — HYDROMORPHONE HCL 2 MG PO TABS: 2.0000 mg | ORAL_TABLET | ORAL | Status: DC | PRN

## 2013-02-15 NOTE — Addendum Note (Signed)
Addendum created 02/15/13 1723 by Gertie Fey, CRNA   Modules edited: Charges VN

## 2013-02-15 NOTE — Progress Notes (Signed)
2 Days Post-Op Procedure(s) (LRB): HYSTERECTOMY ABDOMINAL (N/A)  Subjective: Patient reports she is doing well this AM and would like to go home  Objective: I have reviewed patient's vital signs and she is afebrile  Abdomen is soft and wound is healing well. Bowel sounds are present  Assessment: s/p Procedure(s): HYSTERECTOMY ABDOMINAL (N/A): progressing well and able to be sent home.  Plan:  Discharge home.  Return to clinic in 4 weeks Regular diet Meds: Dilaudid 2 mgs every 4 hours if needed to pain. Condition improved.  LOS: 2 days    ROSS,ALLAN 02/15/2013, 9:08 AM

## 2013-02-15 NOTE — Progress Notes (Signed)
Patient given discharge instructions and questions answered, has prescription. Taken down via wheelchair by NT.

## 2013-02-16 ENCOUNTER — Other Ambulatory Visit: Payer: Self-pay | Admitting: Obstetrics and Gynecology

## 2013-02-16 DIAGNOSIS — R928 Other abnormal and inconclusive findings on diagnostic imaging of breast: Secondary | ICD-10-CM

## 2013-02-19 ENCOUNTER — Encounter (HOSPITAL_COMMUNITY): Payer: Self-pay | Admitting: *Deleted

## 2013-02-19 ENCOUNTER — Other Ambulatory Visit: Payer: Self-pay | Admitting: Obstetrics and Gynecology

## 2013-02-19 ENCOUNTER — Inpatient Hospital Stay (HOSPITAL_COMMUNITY)
Admission: AD | Admit: 2013-02-19 | Discharge: 2013-02-19 | Disposition: A | Discharge status: Home / Self Care | Payer: BC Managed Care – PPO | Source: Ambulatory Visit | Attending: Obstetrics and Gynecology | Admitting: Obstetrics and Gynecology

## 2013-02-19 ENCOUNTER — Inpatient Hospital Stay (HOSPITAL_COMMUNITY): Payer: BC Managed Care – PPO

## 2013-02-19 DIAGNOSIS — R1031 Right lower quadrant pain: Secondary | ICD-10-CM | POA: Insufficient documentation

## 2013-02-19 DIAGNOSIS — G8918 Other acute postprocedural pain: Secondary | ICD-10-CM | POA: Insufficient documentation

## 2013-02-19 HISTORY — DX: Nontoxic goiter, unspecified: E04.9

## 2013-02-19 HISTORY — DX: Gestational diabetes mellitus in pregnancy, unspecified control: O24.419

## 2013-02-19 HISTORY — DX: Depression, unspecified: F32.A

## 2013-02-19 HISTORY — DX: Major depressive disorder, single episode, unspecified: F32.9

## 2013-02-19 HISTORY — DX: Anxiety disorder, unspecified: F41.9

## 2013-02-19 HISTORY — DX: Benign neoplasm of connective and other soft tissue, unspecified: D21.9

## 2013-02-19 LAB — CBC
HCT: 34.2 % — ABNORMAL LOW (ref 36.0–46.0)
Hemoglobin: 12.1 g/dL (ref 12.0–15.0)
MCV: 79.4 fL (ref 78.0–100.0)
RDW: 14.1 % (ref 11.5–15.5)
WBC: 8.3 10*3/uL (ref 4.0–10.5)

## 2013-02-19 LAB — URINALYSIS, ROUTINE W REFLEX MICROSCOPIC
Glucose, UA: NEGATIVE mg/dL
Hgb urine dipstick: NEGATIVE
Specific Gravity, Urine: 1.025 (ref 1.005–1.030)
Urobilinogen, UA: 2 mg/dL — ABNORMAL HIGH (ref 0.0–1.0)

## 2013-02-19 LAB — CREATININE, SERUM
Creatinine, Ser: 0.76 mg/dL (ref 0.50–1.10)
GFR calc Af Amer: >90 mL/min (ref >90)
GFR calc non Af Amer: >90 mL/min (ref >90)

## 2013-02-19 MED ORDER — METHYLPREDNISOLONE ACETATE 80 MG/ML IJ SUSP
20.0000 mg | Freq: Once | INTRAMUSCULAR | Status: AC
  Administered 2013-02-19: 20 mg via INTRAMUSCULAR
  Filled 2013-02-19: qty 1

## 2013-02-19 MED ORDER — IOHEXOL 300 MG/ML  SOLN
100.0000 mL | Freq: Once | INTRAMUSCULAR | Status: AC | PRN
  Administered 2013-02-19: 100 mL via INTRAVENOUS

## 2013-02-19 MED ORDER — BUPIVACAINE HCL (PF) 0.5 % IJ SOLN
30.0000 mL | Freq: Once | INTRAMUSCULAR | Status: AC
  Administered 2013-02-19: 30 mL
  Filled 2013-02-19: qty 30

## 2013-02-19 MED ORDER — IOHEXOL 300 MG/ML  SOLN
50.0000 mL | INTRAMUSCULAR | Status: AC
  Administered 2013-02-19 (×2): 50 mL via ORAL

## 2013-02-19 MED ORDER — HYDROMORPHONE HCL 2 MG PO TABS
2.0000 mg | ORAL_TABLET | ORAL | Status: DC | PRN
  Administered 2013-02-19: 2 mg via ORAL
  Filled 2013-02-19: qty 1

## 2013-02-19 NOTE — MAU Provider Note (Signed)
History     CSN: [id number]  Arrival date and time: 02/19/13 4401   First Provider Initiated Contact with Patient 02/19/13 [phone]      Chief Complaint  Patient presents with  . Post-op Problem   HPI 42 year old black female who underwent an abdominal hysterectomy on February 13, 2013. Procedure was performed without complications and the patient did well and was discharged home on February 15, 2013. Over the weekend she began to complain of pain in the right lower quadrant. It was not associated with any other symptoms other than the pain. There was no nausea , vomiting or diarrhea. She reported passing gas and had BM's. Also was tolerating regular diet. The patient was sent home on Vicodin for pain and reported this did not relieve the symptoms. She is now being assessed as to the etiology of the pain.     Past Medical History  Diagnosis Date  . Heart murmur   . Thyroid nodule   . Hypertension     no meds- being monitored  . Headache     h/o migraines  . Asthma     has inhaler  . Gestational diabetes   . Goiter   . Fibroid   . Anxiety   . Depression     Past Surgical History  Procedure Laterality Date  . Tonsillectomy    . Wisdom tooth extraction    . Cyst excision      x2 behind ear  . Abdominal hysterectomy N/A 02/13/2013    Procedure: HYSTERECTOMY ABDOMINAL;  Surgeon: Miguel Aschoff, MD;  Location: [facility];  Service: Gynecology;  Laterality: N/A;  . Induced abortion      x3    Family History  Problem Relation Age of Onset  . Hypertension Mother   . Depression Mother   . Hypertension Father   . Hyperthyroidism Father   . Hyperthyroidism Paternal Aunt   . Cancer Maternal Grandmother     colon  . Hyperthyroidism Paternal Grandmother     History  Substance Use Topics  . Smoking status: Never Smoker   . Smokeless tobacco: Never Used  . Alcohol Use: Yes     Comment: rare    Allergies:  Allergies  Allergen Reactions  . Ciprofloxin Hcl (Ciprofloxacin) Hives  .  Percocet (Oxycodone-Acetaminophen) Hives    Codeine ok    Prescriptions prior to admission  Medication Sig Dispense Refill  . albuterol (PROVENTIL HFA;VENTOLIN HFA) 108 (90 BASE) MCG/ACT inhaler Inhale 1 puff into the lungs every 6 (six) hours as needed for wheezing or shortness of breath.      Marland Kitchen HYDROmorphone (DILAUDID) 2 MG tablet Take 2 mg by mouth every 4 (four) hours as needed for pain.      Marland Kitchen ibuprofen (ADVIL,MOTRIN) 200 MG tablet Take 400 mg by mouth every 6 (six) hours as needed for pain.      . Simethicone (GAS-X PO) Take 1 tablet by mouth 4 (four) times daily as needed (for gas).      . [DISCONTINUED] HYDROmorphone (DILAUDID) 2 MG tablet Take 1 tablet (2 mg total) by mouth every 4 (four) hours as needed.  30 tablet  0    Review of Systems  Constitutional: Negative for fever, chills and diaphoresis.  Gastrointestinal: Positive for abdominal pain. Negative for heartburn, nausea, vomiting, diarrhea, constipation and blood in stool.  Genitourinary: Negative for dysuria, urgency, frequency, hematuria and flank pain.   Physical Exam   Blood pressure 167/89, temperature 97.6 F (36.4 C), temperature  source Oral, resp. rate 20.  Physical Exam  GI: Soft. Bowel sounds are normal. She exhibits no distension. There is tenderness.  She is tender at the right angle of the incision. There is no hematoma. The incision is clean and dry and appears intact. No collections are noted.    MAU Course  Procedures    Assessment and Plan  Right lower quadrant pain after abdominal hysterectomy. Rule out any compromise of there ureter, torsion of ovary, abscess. May just be secondary to incision, with possible nerve entrapment. Will also look at incision on CT to be sure no collection in the subcutaneous space or separation. Maretta Overdorf 02/19/2013, 8:41 AM

## 2013-02-19 NOTE — MAU Note (Signed)
Incision clean and dry, no warmth or redness noted.  Area of discomfort is to right of incision.

## 2013-02-19 NOTE — MAU Note (Signed)
Is feeling better, "maybe I need to start taking my medicine.  Liked this one better".  Last time took meds was last night.

## 2013-02-19 NOTE — MAU Provider Note (Signed)
  History     CSN: [id number]  Arrival date and time: 02/19/13 7829   First Provider Initiated Contact with Patient 02/19/13 [phone]      Chief Complaint  Patient presents with  . Post-op Problem   HPI  Pain in right side of incision not responding well to medication.  Past Medical History  Diagnosis Date  . Heart murmur   . Thyroid nodule   . Hypertension     no meds- being monitored  . Headache     h/o migraines  . Asthma     has inhaler  . Gestational diabetes   . Goiter   . Fibroid   . Anxiety   . Depression     Past Surgical History  Procedure Laterality Date  . Tonsillectomy    . Wisdom tooth extraction    . Cyst excision      x2 behind ear  . Abdominal hysterectomy N/A 02/13/2013    Procedure: HYSTERECTOMY ABDOMINAL;  Surgeon: Miguel Aschoff, MD;  Location: [facility];  Service: Gynecology;  Laterality: N/A;  . Induced abortion      x3    Family History  Problem Relation Age of Onset  . Hypertension Mother   . Depression Mother   . Hypertension Father   . Hyperthyroidism Father   . Hyperthyroidism Paternal Aunt   . Cancer Maternal Grandmother     colon  . Hyperthyroidism Paternal Grandmother     History  Substance Use Topics  . Smoking status: Never Smoker   . Smokeless tobacco: Never Used  . Alcohol Use: Yes     Comment: rare    Allergies:  Allergies  Allergen Reactions  . Ciprofloxin Hcl (Ciprofloxacin) Hives  . Percocet (Oxycodone-Acetaminophen) Hives    Codeine ok    Prescriptions prior to admission  Medication Sig Dispense Refill  . albuterol (PROVENTIL HFA;VENTOLIN HFA) 108 (90 BASE) MCG/ACT inhaler Inhale 1 puff into the lungs every 6 (six) hours as needed for wheezing or shortness of breath.      Marland Kitchen HYDROmorphone (DILAUDID) 2 MG tablet Take 2 mg by mouth every 4 (four) hours as needed for pain.      Marland Kitchen ibuprofen (ADVIL,MOTRIN) 200 MG tablet Take 400 mg by mouth every 6 (six) hours as needed for pain.      . Simethicone (GAS-X PO) Take 1  tablet by mouth 4 (four) times daily as needed (for gas).      . [DISCONTINUED] HYDROmorphone (DILAUDID) 2 MG tablet Take 1 tablet (2 mg total) by mouth every 4 (four) hours as needed.  30 tablet  0    ROS Physical Exam   Blood pressure 170/82, pulse 71, temperature 97.6 F (36.4 C), temperature source Oral, resp. rate 18.  Physical Exam  MAU Course  Procedures  MDM  CT scan was essentially negative. Feel this is incisional pain. Will inject site with 20cc's of 0.5% marcaine and 20 mgs of  Depo medrol. Then will D/C home.  Then will D/C home.  Assessment and Plan  Post op incisional pain.  Plan as above  Elaisha Zahniser 02/19/2013, 11:24 AM

## 2013-02-19 NOTE — Discharge Summary (Signed)
Carly Lynch, Carly Lynch NO.:  [id number]  MEDICAL RECORD NO.:  [id number]  LOCATION:  9303                          FACILITY:  [facility]  PHYSICIAN:  Miguel Aschoff, M.D.       DATE OF BIRTH:  06-23-1971  DATE OF ADMISSION:  02/13/2013 DATE OF DISCHARGE:  02/15/2013                              DISCHARGE SUMMARY   ADMISSION DIAGNOSES:  Menorrhagia and pelvic pain.  FINAL DIAGNOSES:  Menorrhagia and pelvic pain.  OPERATIONS AND PROCEDURES:  Hysterectomy.  The patient had been on a variety of treatments for her uterine fibroids, but they had grown to the point when she desired a definitive procedure be carried out, and was admitted to the hospital at this time to undergo total abdominal hysterectomy.  Preoperative studies were obtained.  Chemistry profile was within normal limits.  Admission hemoglobin was 12.1.  PT and PTT were within normal limits.  Urinalysis was negative.  HOSPITAL COURSE:  On February 05, 2013, the patient was taken to the operating room on an epidural anesthesia and was without complications. Fibroid uterus approximately 14 weeks equivalent size.  The ovaries appeared to be normal.  No other abnormalities were noted at the time of surgery.  The patient's postoperative course was uncomplicated.  Her hemoglobin postoperatively dropped to 11.3.  She remained afebrile, tolerated increasing diet and ambulation well, was sent home on the second postoperative day.  INSTRUCTIONS:  For home included no heavy lifting, to place nothing in the vagina, to call for any problems such as fever, pain, or heavy bleeding.  She will be seen back in 4 weeks for followup examination. She is to call for a fever or heavy bleeding, or severe pain, to develop.  Medications for home include Vicodin 1 every 4 hours as needed for pain.  She is to call for a postoperative appointment in 4 weeks. She was told to resume all prior medications.     Miguel Aschoff, M.D.     AR/MEDQ   D:  02/18/2013  T:  02/19/2013  Job:  [id number]

## 2013-02-19 NOTE — MAU Note (Signed)
Had abd hyst last Tues.  Was discharged on Thurs.  Pain in RLQ,  Called MD is taking pain meds, passing gas and has had a BM, in last 24- 48 hrs the pain seems to be getting worse.  Also having frequency and pain sometimes with urination.

## 2013-03-20 ENCOUNTER — Ambulatory Visit
Admission: RE | Admit: 2013-03-20 | Discharge: 2013-03-20 | Disposition: A | Discharge status: Home / Self Care | Payer: BC Managed Care – PPO | Source: Ambulatory Visit | Attending: Obstetrics and Gynecology | Admitting: Obstetrics and Gynecology

## 2013-03-20 DIAGNOSIS — R928 Other abnormal and inconclusive findings on diagnostic imaging of breast: Secondary | ICD-10-CM

## 2013-04-02 ENCOUNTER — Other Ambulatory Visit: Payer: Self-pay | Admitting: Endocrinology

## 2013-04-02 DIAGNOSIS — E049 Nontoxic goiter, unspecified: Secondary | ICD-10-CM

## 2013-04-10 ENCOUNTER — Ambulatory Visit
Admission: RE | Admit: 2013-04-10 | Discharge: 2013-04-10 | Disposition: A | Discharge status: Home / Self Care | Payer: BC Managed Care – PPO | Source: Ambulatory Visit | Attending: Endocrinology | Admitting: Endocrinology

## 2013-04-10 DIAGNOSIS — E049 Nontoxic goiter, unspecified: Secondary | ICD-10-CM

## 2014-10-07 ENCOUNTER — Encounter (HOSPITAL_COMMUNITY): Payer: Self-pay | Admitting: *Deleted

## 2015-10-15 ENCOUNTER — Inpatient Hospital Stay (HOSPITAL_COMMUNITY)
Admission: AD | Admit: 2015-10-15 | Discharge: 2015-10-15 | Disposition: A | Discharge status: Home / Self Care | Payer: BLUE CROSS/BLUE SHIELD | Source: Ambulatory Visit | Attending: Obstetrics | Admitting: Obstetrics

## 2015-10-15 ENCOUNTER — Encounter (HOSPITAL_COMMUNITY): Payer: Self-pay | Admitting: Advanced Practice Midwife

## 2015-10-15 DIAGNOSIS — R109 Unspecified abdominal pain: Secondary | ICD-10-CM | POA: Insufficient documentation

## 2015-10-15 DIAGNOSIS — R319 Hematuria, unspecified: Secondary | ICD-10-CM | POA: Diagnosis not present

## 2015-10-15 DIAGNOSIS — M545 Low back pain: Secondary | ICD-10-CM | POA: Insufficient documentation

## 2015-10-15 LAB — URINALYSIS, ROUTINE W REFLEX MICROSCOPIC
BILIRUBIN URINE: NEGATIVE
Glucose, UA: NEGATIVE mg/dL
KETONES UR: NEGATIVE mg/dL
Leukocytes, UA: NEGATIVE
Nitrite: NEGATIVE
PROTEIN: NEGATIVE mg/dL
Specific Gravity, Urine: 1.025 (ref 1.005–1.030)
UROBILINOGEN UA: 0.2 mg/dL (ref 0.0–1.0)
pH: 6 (ref 5.0–8.0)

## 2015-10-15 LAB — URINE MICROSCOPIC-ADD ON

## 2015-10-15 MED ORDER — CYCLOBENZAPRINE HCL 5 MG PO TABS: 5.0000 mg | ORAL_TABLET | Freq: Three times a day (TID) | ORAL | Status: DC | PRN

## 2015-10-15 NOTE — Discharge Instructions (Signed)

## 2015-10-15 NOTE — MAU Note (Signed)
Pt presents to MAU with complaints of back pain with painful urination on Sunday that stopped and has started back today. Denies any vaginal bleeding or abnormal discharge

## 2015-10-15 NOTE — MAU Provider Note (Signed)
Chief Complaint: Back Pain and Dysuria   First Provider Initiated Contact with Patient 10/15/15 1059     SUBJECTIVE HPI: Carly Lynch is a 44 y.o. [id number] who presents to [facility] reporting low back pain radiating around side to abdomen.  Colicky and crampy. Was severe yesterday , better today. Feels some pressure in bladder but no pain.  Saw her Nurse Practitioner at work who wanted to refer her to Ortho, but patient thinks she has a kidney stone (has never had one before) so she came here. States NP sent a urine culture on her and gave her an antibiotic which patient has not filled.    Location: Right lower back radiating around side Quality: aching cramping Severity: severe yesterday, mild today Duration: 2 days Signs and symptoms: as above  Past Medical History  Diagnosis Date  . Heart murmur   . Thyroid nodule   . Hypertension     no meds- being monitored  . Headache(784.0)     h/o migraines  . Asthma     has inhaler  . Gestational diabetes   . Goiter   . Fibroid   . Anxiety   . Depression    OB History  Gravida Para Term Preterm AB SAB TAB Ectopic Multiple Living  6 3 3  0 3 0 3 0 0 3    # Outcome Date GA Lbr Len/2nd Weight Sex Delivery Anes PTL Lv  6 TAB           5 TAB           4 TAB           3 Term        N   2 Term        N   1 Term      Vag-Spont  Y      Past Surgical History  Procedure Laterality Date  . Tonsillectomy    . Wisdom tooth extraction    . Cyst excision      x2 behind ear  . Abdominal hysterectomy N/A 02/13/2013    Procedure: HYSTERECTOMY ABDOMINAL;  Surgeon: Gus Height, MD;  Location: [facility];  Service: Gynecology;  Laterality: N/A;  . Induced abortion      x3   Social History   Social History  . Marital Status: Married    Spouse Name: N/A  . Number of Children: N/A  . Years of Education: N/A   Occupational History  . Not on file.   Social History Main Topics  . Smoking status: Never Smoker   . Smokeless  tobacco: Never Used  . Alcohol Use: Yes     Comment: rare  . Drug Use: No  . Sexual Activity: Not Currently   Other Topics Concern  . Not on file   Social History Narrative   No current facility-administered medications on file prior to encounter.   Current Outpatient Prescriptions on File Prior to Encounter  Medication Sig Dispense Refill  . albuterol (PROVENTIL HFA;VENTOLIN HFA) 108 (90 BASE) MCG/ACT inhaler Inhale 1 puff into the lungs every 6 (six) hours as needed for wheezing or shortness of breath.     Allergies  Allergen Reactions  . Ciprofloxin Hcl [Ciprofloxacin] Hives  . Percocet [Oxycodone-Acetaminophen] Hives    Codeine ok    I have reviewed the past Medical Hx, Surgical Hx, Social Hx, Allergies and Medications.   Review of Systems  Constitutional: Negative for fever, chills and malaise/fatigue.  Respiratory: Negative for shortness  of breath.   Cardiovascular: Negative for chest pain.  Gastrointestinal: Positive for abdominal pain. Negative for nausea, vomiting, diarrhea and constipation.  Genitourinary: Negative for dysuria (but has pressure).  Musculoskeletal: Negative for back pain.  Neurological: Negative for weakness and headaches.    OBJECTIVE Patient Vitals for the past 24 hrs:  BP Temp Pulse Resp  10/15/15 1051 142/79 mmHg - 67 16  10/15/15 1028 136/92 mmHg 98.5 F (36.9 C) 69 18   Constitutional: Well-developed, well-nourished female in no acute distress.  Cardiovascular: normal rate Respiratory: normal rate and effort.  GI: Abd soft, non-tender, gravid appropriate for gestational age. Pos BS x 4   Mildly tender RLQ, no rebound MS: Extremities nontender, no edema, normal ROM      Low back tender to palpation on right, ventrogluteal and dorsogluteal tender Neurologic: Alert and oriented x 4.  GU: Neg CVAT.  SPECULUM EXAM: deferred  BIMANUAL: deferred  LAB RESULTS  Results for orders placed or performed during the hospital encounter of  10/15/15 (from the past 24 hour(s))  Urinalysis, Routine w reflex microscopic (not at [facility])     Status: Abnormal   Collection Time: 10/15/15 10:30 AM  Result Value Ref Range   Color, Urine YELLOW YELLOW   APPearance CLEAR CLEAR   Specific Gravity, Urine 1.025 1.005 - 1.030   pH 6.0 5.0 - 8.0   Glucose, UA NEGATIVE NEGATIVE mg/dL   Hgb urine dipstick TRACE (A) NEGATIVE   Bilirubin Urine NEGATIVE NEGATIVE   Ketones, ur NEGATIVE NEGATIVE mg/dL   Protein, ur NEGATIVE NEGATIVE mg/dL   Urobilinogen, UA 0.2 0.0 - 1.0 mg/dL   Nitrite NEGATIVE NEGATIVE   Leukocytes, UA NEGATIVE NEGATIVE  Urine microscopic-add on     Status: Abnormal   Collection Time: 10/15/15 10:30 AM  Result Value Ref Range   Squamous Epithelial / LPF FEW (A) RARE   RBC / HPF 0-2 <3 RBC/hpf   Bacteria, UA RARE RARE    IMAGING No results found.  MDM UA obtained to evaluate for signs of infection or hematuria Dr Camillo Flaming re Presentation, exam findings and lab results.  She prefers patient be evaluated in full by her primary doctor.  We can give her an Rx for Flexeril to see if this relieves muscle spasm  ASSESSMENT Low back pain Low abdominal pain, probably related to back pain Trace hematuria No sign of UTI  PLAN Discharge home in stable condition. Rx Flexeril 5mg  q8hrs prn Precautions about side effects Discussed follow up with urine culture at work, if +, needs to take Rx Follow up with Primary Doctor if Flexeril does not improve pain    Medication List    ASK your doctor about these medications        acetaminophen 500 MG tablet  Commonly known as:  TYLENOL  Take 500 mg by mouth every 6 (six) hours as needed for mild pain or headache.     albuterol 108 (90 BASE) MCG/ACT inhaler  Commonly known as:  PROVENTIL HFA;VENTOLIN HFA  Inhale 1 puff into the lungs every 6 (six) hours as needed for wheezing or shortness of breath.     BIOTIN PO  Take 1 tablet by mouth 2 (two) times daily.      CHOLECALCIFEROL PO  Take 1 tablet by mouth every other day.     hydrochlorothiazide 25 MG tablet  Commonly known as:  HYDRODIURIL  Take 25 mg by mouth daily.     levothyroxine 100 MCG tablet  Commonly known  as:  SYNTHROID, LEVOTHROID  Take 100-150 mcg by mouth as directed. Take one tablet daily Monday-Saturday, take one and a half tablet on Sunday     TURMERIC PO  Take 1 tablet by mouth 2 (two) times daily.     VITAMIN K PO  Take 1 tablet by mouth daily.         Seabron Spates, CNM 10/15/2015  11:20 AM

## 2018-03-01 ENCOUNTER — Other Ambulatory Visit: Payer: Self-pay | Admitting: Obstetrics & Gynecology

## 2018-03-01 DIAGNOSIS — R928 Other abnormal and inconclusive findings on diagnostic imaging of breast: Secondary | ICD-10-CM

## 2018-03-06 ENCOUNTER — Ambulatory Visit
Admission: RE | Admit: 2018-03-06 | Discharge: 2018-03-06 | Disposition: A | Discharge status: Home / Self Care | Payer: BLUE CROSS/BLUE SHIELD | Source: Ambulatory Visit | Attending: Obstetrics & Gynecology | Admitting: Obstetrics & Gynecology

## 2018-03-06 ENCOUNTER — Ambulatory Visit: Admission: RE | Admit: 2018-03-06 | Payer: BLUE CROSS/BLUE SHIELD | Source: Ambulatory Visit

## 2018-03-06 DIAGNOSIS — R928 Other abnormal and inconclusive findings on diagnostic imaging of breast: Secondary | ICD-10-CM

## 2018-11-07 ENCOUNTER — Emergency Department (HOSPITAL_COMMUNITY)
Admission: EM | Admit: 2018-11-07 | Discharge: 2018-11-07 | Disposition: A | Discharge status: Home / Self Care | Payer: BLUE CROSS/BLUE SHIELD | Attending: Emergency Medicine | Admitting: Emergency Medicine

## 2018-11-07 ENCOUNTER — Emergency Department (HOSPITAL_COMMUNITY): Payer: BLUE CROSS/BLUE SHIELD

## 2018-11-07 ENCOUNTER — Encounter (HOSPITAL_COMMUNITY): Payer: Self-pay

## 2018-11-07 ENCOUNTER — Other Ambulatory Visit: Payer: Self-pay

## 2018-11-07 ENCOUNTER — Ambulatory Visit (HOSPITAL_COMMUNITY): Payer: BLUE CROSS/BLUE SHIELD

## 2018-11-07 DIAGNOSIS — J45909 Unspecified asthma, uncomplicated: Secondary | ICD-10-CM | POA: Insufficient documentation

## 2018-11-07 DIAGNOSIS — R079 Chest pain, unspecified: Secondary | ICD-10-CM | POA: Diagnosis present

## 2018-11-07 DIAGNOSIS — I10 Essential (primary) hypertension: Secondary | ICD-10-CM | POA: Insufficient documentation

## 2018-11-07 DIAGNOSIS — R0789 Other chest pain: Secondary | ICD-10-CM | POA: Diagnosis not present

## 2018-11-07 DIAGNOSIS — Z79899 Other long term (current) drug therapy: Secondary | ICD-10-CM | POA: Diagnosis not present

## 2018-11-07 DIAGNOSIS — R1011 Right upper quadrant pain: Secondary | ICD-10-CM | POA: Diagnosis not present

## 2018-11-07 LAB — BASIC METABOLIC PANEL
Anion gap: 10 (ref 5–15)
BUN: 13 mg/dL (ref 6–20)
CHLORIDE: 98 mmol/L (ref 98–111)
CO2: 29 mmol/L (ref 22–32)
Calcium: 9.9 mg/dL (ref 8.9–10.3)
Creatinine, Ser: 0.82 mg/dL (ref 0.44–1.00)
GFR calc Af Amer: >60 mL/min (ref >60)
GFR calc non Af Amer: >60 mL/min (ref >60)
GLUCOSE: 108 mg/dL — AB (ref 70–99)
POTASSIUM: 3.3 mmol/L — AB (ref 3.5–5.1)
Sodium: 137 mmol/L (ref 135–145)

## 2018-11-07 LAB — HEPATIC FUNCTION PANEL
ALT: 20 U/L (ref 0–44)
AST: 24 U/L (ref 15–41)
Albumin: 3.7 g/dL (ref 3.5–5.0)
Alkaline Phosphatase: 53 U/L (ref 38–126)
Bilirubin, Direct: <0.1 mg/dL (ref 0.0–0.2)
Total Bilirubin: 0.5 mg/dL (ref 0.3–1.2)
Total Protein: 7.7 g/dL (ref 6.5–8.1)

## 2018-11-07 LAB — I-STAT TROPONIN, ED
Troponin i, poc: 0 ng/mL (ref 0.00–0.08)
Troponin i, poc: 0 ng/mL (ref 0.00–0.08)

## 2018-11-07 LAB — I-STAT BETA HCG BLOOD, ED (MC, WL, AP ONLY): I-stat hCG, quantitative: <5 m[IU]/mL (ref <5)

## 2018-11-07 LAB — CBC
HEMATOCRIT: 41.3 % (ref 36.0–46.0)
HEMOGLOBIN: 13.5 g/dL (ref 12.0–15.0)
MCH: 26.7 pg (ref 26.0–34.0)
MCHC: 32.7 g/dL (ref 30.0–36.0)
MCV: 81.8 fL (ref 80.0–100.0)
Platelets: 192 10*3/uL (ref 150–400)
RBC: 5.05 MIL/uL (ref 3.87–5.11)
RDW: 14.4 % (ref 11.5–15.5)
WBC: 9.2 10*3/uL (ref 4.0–10.5)
nRBC: 0 % (ref 0.0–0.2)

## 2018-11-07 LAB — LIPASE, BLOOD: Lipase: 57 U/L — ABNORMAL HIGH (ref 11–51)

## 2018-11-07 MED ORDER — OMEPRAZOLE 20 MG PO CPDR: 20.0000 mg | DELAYED_RELEASE_CAPSULE | Freq: Every day | ORAL | 0 refills | Status: DC

## 2018-11-07 NOTE — Discharge Instructions (Signed)
Your work-up today was overall reassuring.  As we discussed, your heart score was low and you had a negative cardiac enzymes on repeat testing.  We have low suspicion for blood clot based on the scoring system.  Your work-up was otherwise reassuring.  We feel you are safe for discharge home and we suspect it is due to reflux.  Please use the medication we prescribed to help with this and follow-up with your GI doctor.  Please also follow-up with your PCP.  If any symptoms change or worsen, please return to the nearest emergency department

## 2018-11-07 NOTE — ED Notes (Signed)
Patient verbalizes understanding of discharge instructions. Opportunity for questioning and answers were provided. Pt discharged from ED. 

## 2018-11-07 NOTE — ED Triage Notes (Signed)
Pt states that she had central CP that woke her up around 2am with some nausea, radiation to back. Pain is worse with laying down

## 2018-11-07 NOTE — ED Provider Notes (Signed)
[facility] EMERGENCY DEPARTMENT Provider Note   CSN: [id number] Arrival date & time: 11/07/18  0556     History   Chief Complaint Chief Complaint  Patient presents with  . Chest Pain    HPI Carly Lynch is a 47 y.o. female.  The history is provided by the patient and medical records. No language interpreter was used.  Chest Pain   This is a new problem. The current episode started 3 to 5 hours ago. The problem occurs rarely. The problem has been resolved. Associated with: laying back. The pain is present in the substernal region. The pain is moderate. The quality of the pain is described as sharp. The pain radiates to the epigastrium and mid back. Duration of episode(s) is 5 hours. Associated symptoms include abdominal pain, back pain and nausea. Pertinent negatives include no cough, no diaphoresis, no dizziness, no fever, no headaches, no irregular heartbeat, no leg pain, no lower extremity edema, no malaise/fatigue, no numbness, no palpitations, no shortness of breath, no sputum production, no vomiting and no weakness. She has tried antacids for the symptoms. The treatment provided significant relief. Risk factors include stress.  Her past medical history is significant for hypertension.    Past Medical History:  Diagnosis Date  . Anxiety   . Asthma    has inhaler  . Depression   . Fibroid   . Gestational diabetes   . Goiter   . Headache(784.0)    h/o migraines  . Heart murmur   . Hypertension    no meds- being monitored  . Thyroid nodule     There are no active problems to display for this patient.   Past Surgical History:  Procedure Laterality Date  . ABDOMINAL HYSTERECTOMY N/A 02/13/2013   Procedure: HYSTERECTOMY ABDOMINAL;  Surgeon: Gus Height, MD;  Location: [facility];  Service: Gynecology;  Laterality: N/A;  . CYST EXCISION     x2 behind ear  . INDUCED ABORTION     x3  . TONSILLECTOMY    . WISDOM TOOTH EXTRACTION       OB  History    Gravida  6   Para  3   Term  3   Preterm  0   AB  3   Living  3     SAB  0   TAB  3   Ectopic  0   Multiple  0   Live Births               Home Medications    Prior to Admission medications   Medication Sig Start Date End Date Taking? Authorizing Provider  albuterol (PROVENTIL HFA;VENTOLIN HFA) 108 (90 BASE) MCG/ACT inhaler Inhale 1 puff into the lungs every 6 (six) hours as needed for wheezing or shortness of breath.    [provider]  BIOTIN PO Take 1 tablet by mouth 2 (two) times daily.    [provider]  CHOLECALCIFEROL PO Take 1 tablet by mouth every other day.    [provider]  cyclobenzaprine (FLEXERIL) 5 MG tablet Take 1 tablet (5 mg total) by mouth every 8 (eight) hours as needed for muscle spasms. 10/15/15   Seabron Spates, CNM  hydrochlorothiazide (HYDRODIURIL) 25 MG tablet Take 25 mg by mouth daily.    [provider]  levothyroxine (SYNTHROID, LEVOTHROID) 100 MCG tablet Take 100-150 mcg by mouth as directed. Take one tablet daily Monday-Saturday, take one and a half tablet on Sunday  [provider]  TURMERIC PO Take 1 tablet by mouth 2 (two) times daily.    [provider]  VITAMIN K PO Take 1 tablet by mouth daily.    [provider]    Family History Family History  Problem Relation Age of Onset  . Hypertension Mother   . Depression Mother   . Hypertension Father   . Hyperthyroidism Father   . Hyperthyroidism Paternal Aunt   . Cancer Maternal Grandmother        colon  . Hyperthyroidism Paternal Grandmother     Social History Social History   Tobacco Use  . Smoking status: Never Smoker  . Smokeless tobacco: Never Used  Substance Use Topics  . Alcohol use: Yes    Comment: rare  . Drug use: No     Allergies   Ciprofloxin hcl [ciprofloxacin] and Percocet [oxycodone-acetaminophen]   Review of Systems Review of Systems  Constitutional: Negative for  chills, diaphoresis, fatigue, fever and malaise/fatigue.  HENT: Negative for congestion and rhinorrhea.   Eyes: Negative for visual disturbance.  Respiratory: Negative for cough, sputum production, chest tightness, shortness of breath and wheezing.   Cardiovascular: Positive for chest pain. Negative for palpitations and leg swelling.  Gastrointestinal: Positive for abdominal pain and nausea. Negative for constipation, diarrhea and vomiting.  Genitourinary: Negative for dysuria and flank pain.  Musculoskeletal: Positive for back pain. Negative for neck pain and neck stiffness.  Skin: Negative for rash and wound.  Neurological: Negative for dizziness, weakness, light-headedness, numbness and headaches.  Psychiatric/Behavioral: Negative for agitation.  All other systems reviewed and are negative.    Physical Exam Updated Vital Signs BP (!) 180/81   Pulse 77   Temp 97.9 F (36.6 C) (Oral)   Resp 20   SpO2 100%   Physical Exam  Constitutional: She appears well-developed and well-nourished. No distress.  HENT:  Head: Normocephalic and atraumatic.  Mouth/Throat: Oropharynx is clear and moist. No oropharyngeal exudate.  Eyes: Pupils are equal, round, and reactive to light. Conjunctivae and EOM are normal.  Neck: Normal range of motion. Neck supple.  Cardiovascular: Normal rate and regular rhythm.  No murmur heard. Pulmonary/Chest: Effort normal and breath sounds normal. No respiratory distress. She has no wheezes. She has no rales. She exhibits no tenderness.  Abdominal: Soft. She exhibits no distension. There is tenderness in the right upper quadrant and epigastric area. There is no rigidity, no rebound, no guarding and no CVA tenderness.    Musculoskeletal: She exhibits no edema or tenderness.  Neurological: She is alert. No sensory deficit. She exhibits normal muscle tone.  Skin: Skin is warm and dry. Capillary refill takes less than 2 seconds. No rash noted. She is not diaphoretic.  No erythema.  Psychiatric: She has a normal mood and affect.  Nursing note and vitals reviewed.    ED Treatments / Results  Labs (all labs ordered are listed, but only abnormal results are displayed) Labs Reviewed  BASIC METABOLIC PANEL - Abnormal; Notable for the following components:      Result Value   Potassium 3.3 (*)    Glucose, Bld 108 (*)    All other components within normal limits  LIPASE, BLOOD - Abnormal; Notable for the following components:   Lipase 57 (*)    All other components within normal limits  CBC  HEPATIC FUNCTION PANEL  I-STAT TROPONIN, ED  I-STAT BETA HCG BLOOD, ED ([facility], [facility], [facility] ONLY)  I-STAT TROPONIN, ED    EKG EKG Interpretation  Date/Time:  Tuesday November 07 2018 07:00:03 EST Ventricular Rate:  75 PR Interval:  168 QRS Duration: 102 QT Interval:  400 QTC Calculation: 446 R Axis:   -10 Text Interpretation:  Normal sinus rhythm Possible Left atrial enlargement Septal infarct , age undetermined Abnormal ECG When compared to prior, no signifiant changes seen.  No STEMI Confirmed by Carly Lynch [phone]) on 11/07/2018 7:46:40 AM   Radiology Dg Chest 2 View  Result Date: 11/07/2018 CLINICAL DATA:  47 year old female with chest pain. EXAM: CHEST - 2 VIEW COMPARISON:  Chest radiograph dated 11/20/2007 FINDINGS: The heart size and mediastinal contours are within normal limits. Both lungs are clear. The visualized skeletal structures are unremarkable. IMPRESSION: No active cardiopulmonary disease. Electronically Signed   By: Carly Crete M.D.   On: 11/07/2018 06:41   US Abdomen Limited Ruq  Result Date: 11/07/2018 CLINICAL DATA:  Right upper quadrant pain EXAM: ULTRASOUND ABDOMEN LIMITED RIGHT UPPER QUADRANT COMPARISON:  None. FINDINGS: Gallbladder: No gallstones or wall thickening visualized. No sonographic Murphy sign noted by sonographer. Common bile duct: Diameter: 2.8 mm Liver: No focal lesion identified. Within normal limits in parenchymal  echogenicity. Portal vein is patent on color Doppler imaging with normal direction of blood flow towards the liver. IMPRESSION: No acute abnormality noted. Electronically Signed   By: Carly Lynch M.D.   On: 11/07/2018 09:35    Procedures Procedures (including critical care time)  Medications Ordered in ED Medications - No data to display   Initial Impression / Assessment and Plan / ED Course  I have reviewed the triage vital signs and the nursing notes.  Pertinent labs & imaging results that were available during my care of the patient were reviewed by me and considered in my medical decision making (see chart for details).     Carly Lynch is a 47 y.o. female with a past medical history significant for hypertension, asthma, prior hysterectomy for fibroids, hypothyroidism, and anxiety who presents with upper abdominal pain, chest pain, and nausea.  Patient reports that since March she has been struggling with upper abdominal discomfort and has currently had a colonoscopy that was reassuring.  She is scheduled for an endoscopy in 2 weeks and says she has continued to have on and off abdominal discomforts.  She says is usually in her epigastrium and right upper quadrant.  She reports that this morning at around 2 AM she woke up with epigastric and left lower chest pain that radiated around towards her back.  She reports that it was worse when she laid down and was better when sitting up.  She reported no emesis, diaphoresis, shortness of breath.  The pain was not allergic and was not exertional.  She reports no palpitations or syncope.  She reports no recent medication changes.  She says that she had a very spicy [place] dish last night before bed.  When she woke up she thought it was gas and abdominal pains primarily and took several medications including Tums, baking soda, and water.  She says that after arrival to the emergency department her discomfort in the abdomen and chest has  improved significantly.  She denies any leg pain or leg swelling recently no history of DVT or PE.  No recent trauma or other complaints.    On exam, right upper quadrant and epigastrium are tender to palpation.  Lungs are clear.  Chest is nontender.  Back is nontender.  Patient has symmetric pulses in upper and lower extremities.  No focal  neurologic deficits on exam.  No murmur was appreciated.  Clinically I suspect patient is having more of a reflux type pain given the oppositionality, the recent spicy food, her history of abdominal pains, and the other lack of concerning symptoms.  EKG showed no STEMI on arrival.  Patient's initial troponin was negative.  Chest x-ray showed no evidence of wide mediastinum or other significant normality.  BMP was performed showing normal kidney function and mild hypokalemia.    Patient will have lipase, hepatic function and a delta troponin performed.  Due to the tenderness in the right upper quadrant epigastrium she will have a right upper quadrant ultrasound.    Heart score calculated as a 2.  Patient is PERC negative.   She remains chest pain and abdominal pain-free however if she starts having symptoms we will give GI cocktail.    Anticipate discharge if work-up is reassuring for her to continue her outpatient GI work-up.  Patient second troponin was negative.  Patient had no further chest pain or abdominal discomfort in the emergency department.  I suspect that this is related to reflux after her spicy [place] food last night.  Patient will be started on Prilosec and will follow-up with her gastroneurologist this month.  She will also follow-up with PCP.    Patient understood return precautions and had no other questions or concerns.  Patient was discharged in good condition with resolution of symptoms.   Final Clinical Impressions(s) / ED Diagnoses   Final diagnoses:  RUQ abdominal pain  Chest pain, unspecified type  Atypical chest pain    ED  Discharge Orders         Ordered    omeprazole (PRILOSEC) 20 MG capsule  Daily     11/07/18 1039          Clinical Impression: 1. Chest pain, unspecified type   2. RUQ abdominal pain   3. Atypical chest pain     Disposition: Discharge  Condition: Good  I have discussed the results, Dx and Tx plan with the pt(& family if present). He/she/they expressed understanding and agree(s) with the plan. Discharge instructions discussed at great length. Strict return precautions discussed and pt &/or family have verbalized understanding of the instructions. No further questions at time of discharge.    New Prescriptions   OMEPRAZOLE (PRILOSEC) 20 MG CAPSULE    Take 1 capsule (20 mg total) by mouth daily.    Follow Up: Carly Fanny, MD [place] [address] [phone]     [facility] EMERGENCY DEPARTMENT [address] [id number] mc [place]       Carly Lynch, Carly Allegra, MD 11/07/18 1041

## 2018-11-07 NOTE — ED Notes (Signed)
Patient transported to Ultrasound 

## 2019-02-24 IMAGING — MG DIGITAL DIAGNOSTIC UNILATERAL RIGHT MAMMOGRAM WITH TOMO AND CAD
6 series · 6 of 18 positions shown · non-contrast
Comparison: Previous exam(s).

CLINICAL DATA: 47-year-old female for further evaluation of
possible RIGHT breast asymmetry on screening mammogram.

EXAM:
DIGITAL DIAGNOSTIC UNILATERAL RIGHT MAMMOGRAM WITH CAD AND TOMO

[R CC synth-2D (1 of 2)]
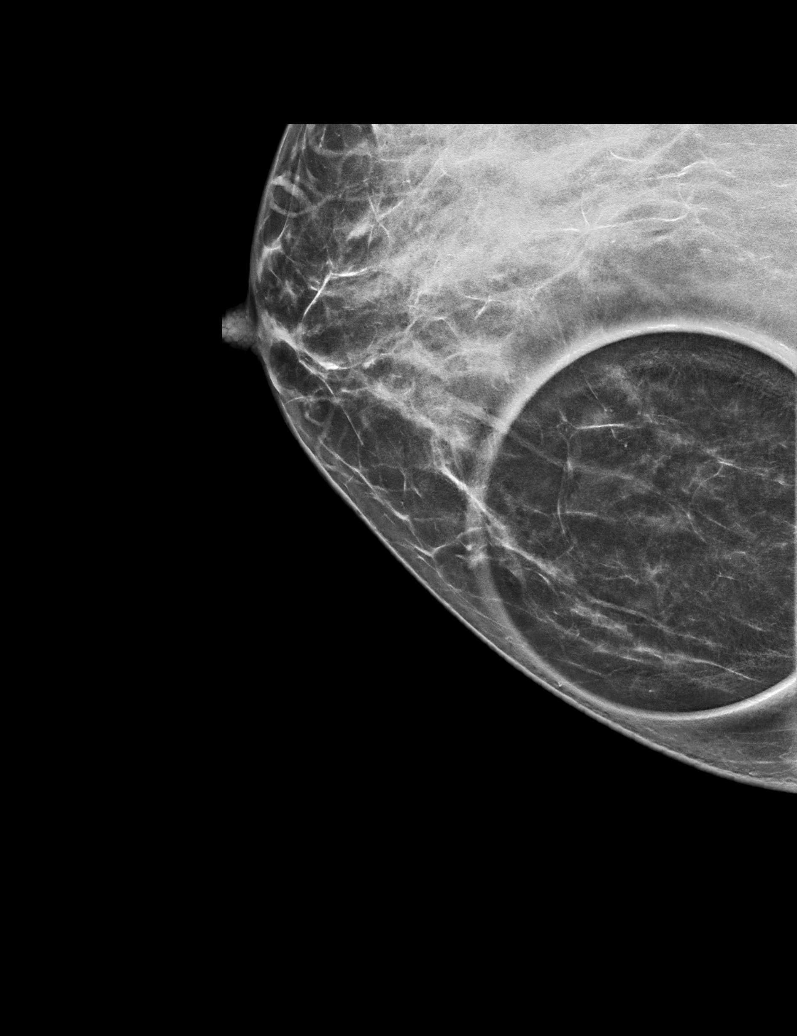

[R CC synth-2D (2 of 2)]
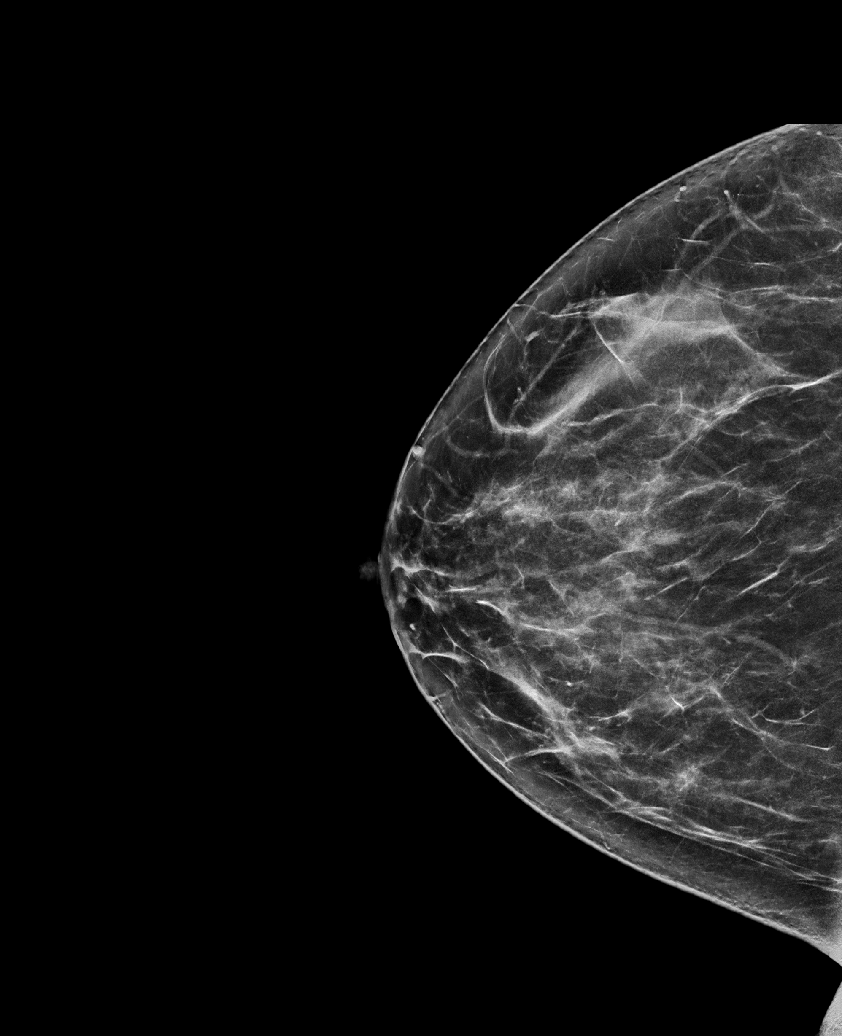

[R MLO synth-2D]
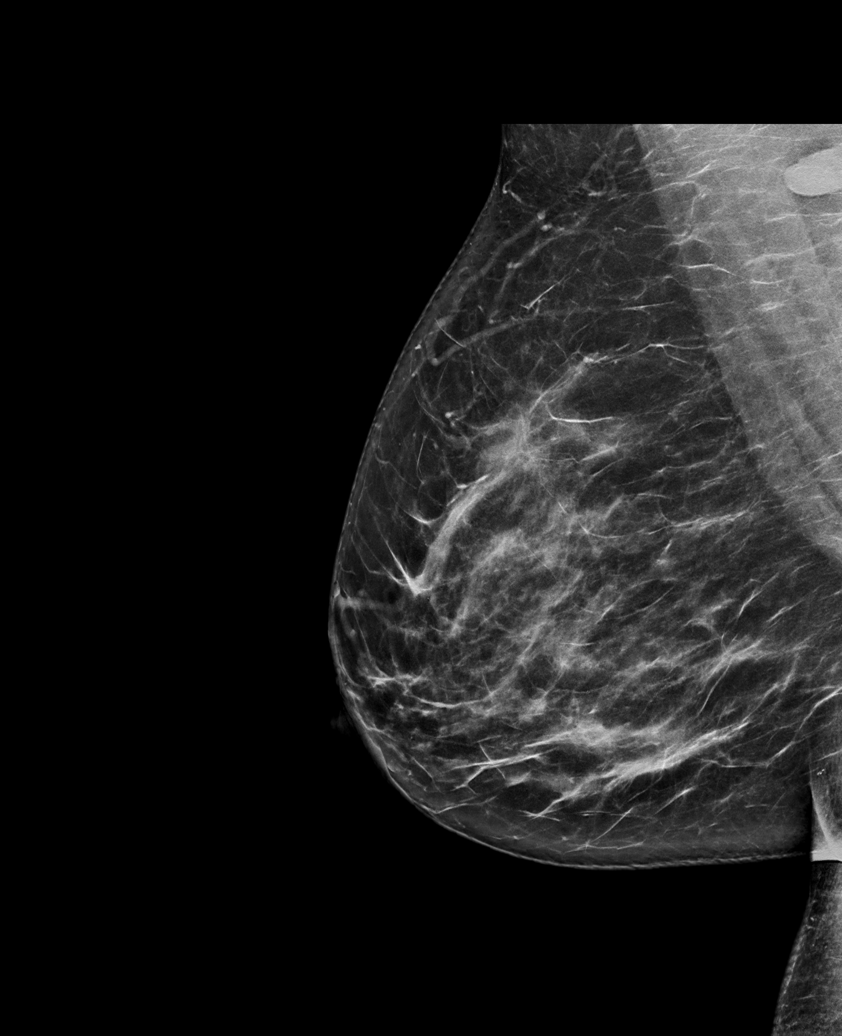

[R CC tomo (1 of 2) · tomo slice 29/58.0]
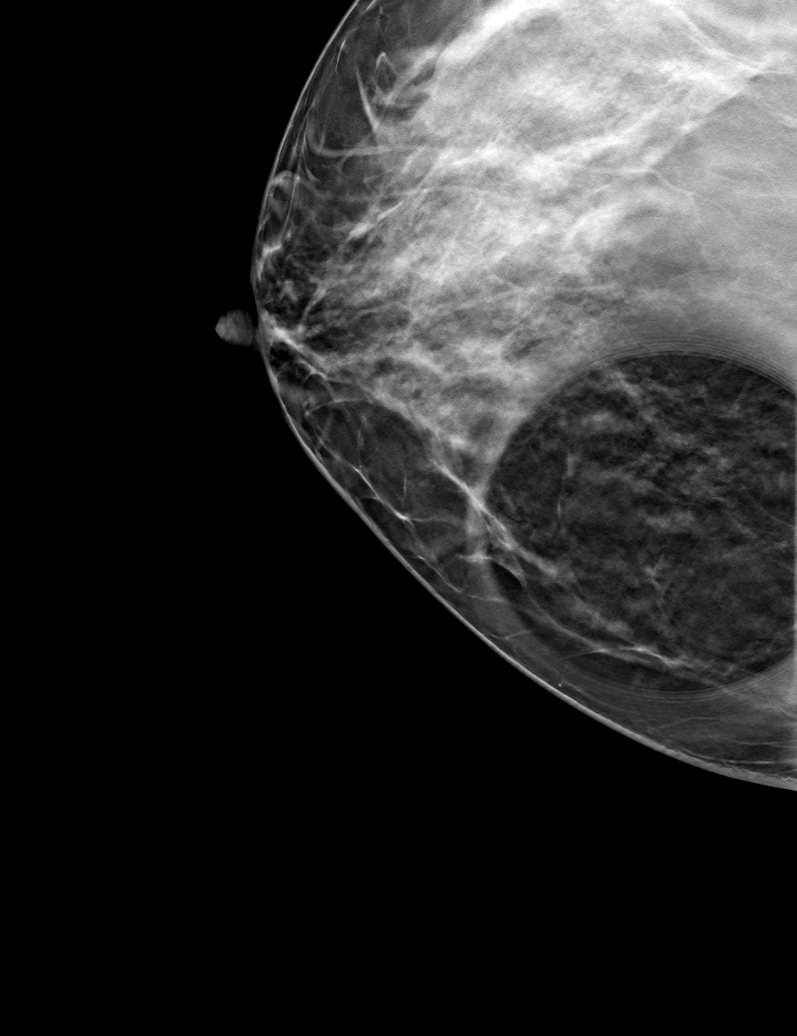

[R MLO tomo · tomo slice 40/79.0]
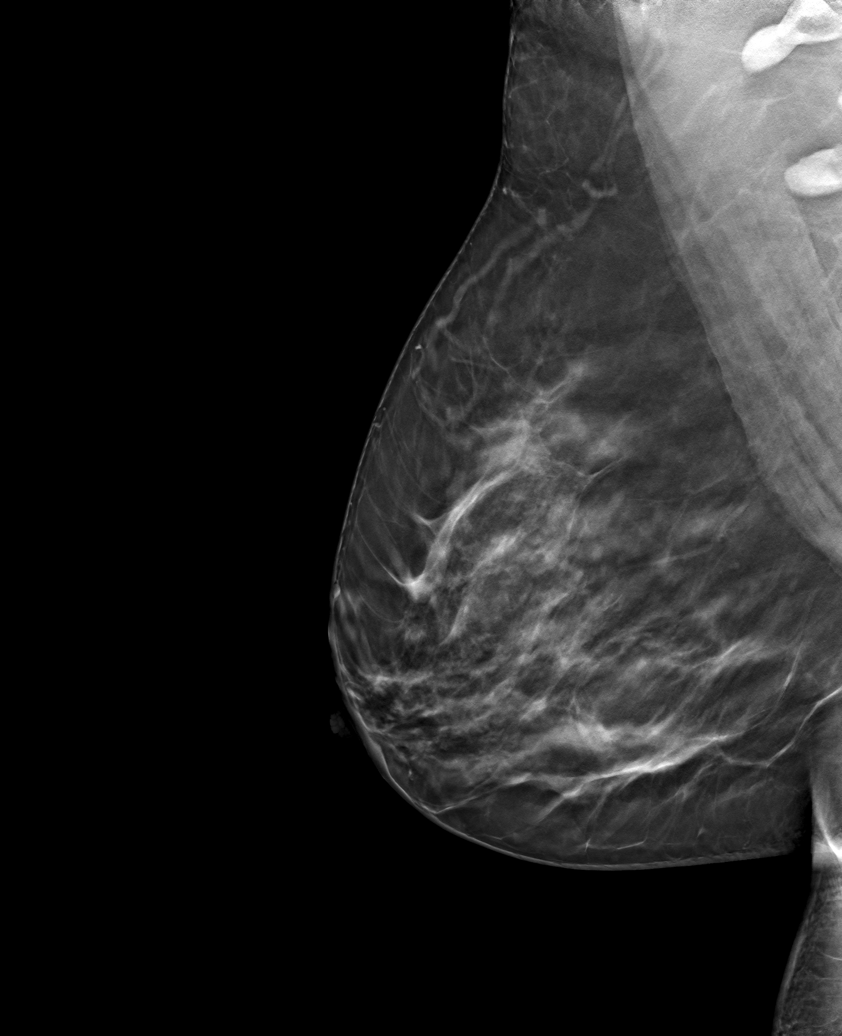

[R CC tomo (2 of 2) · tomo slice 37/74.0]
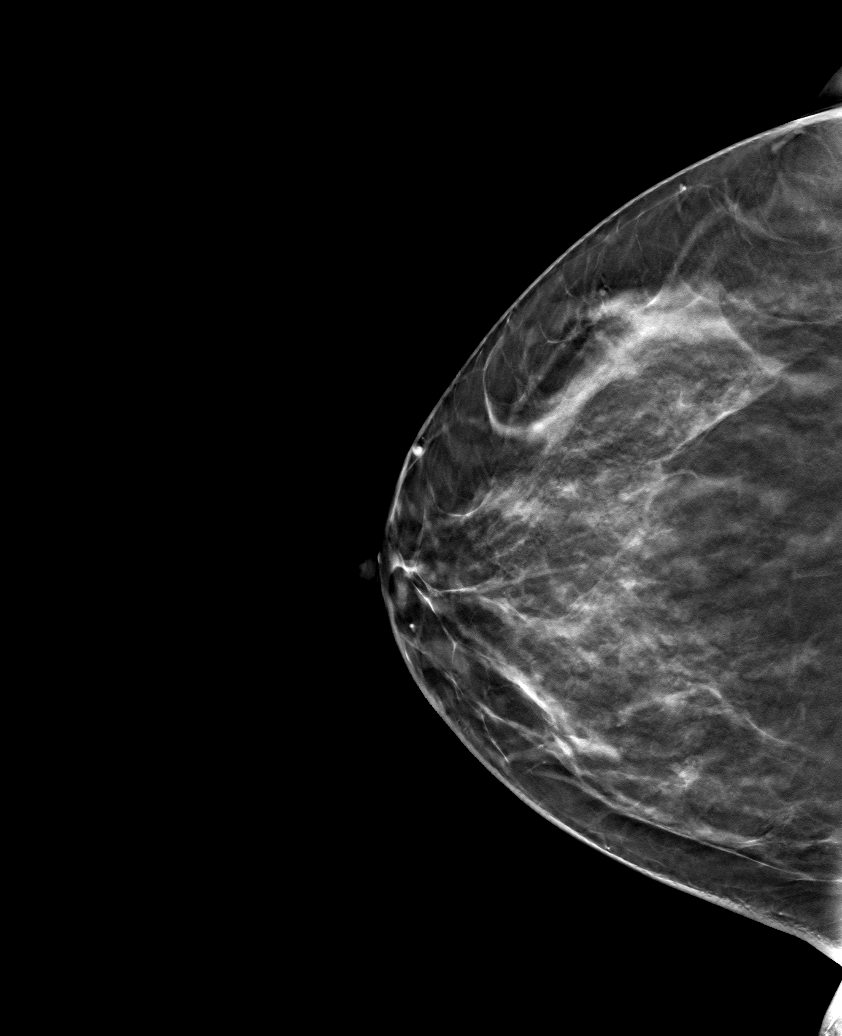

[6 of 18 positions shown; findings below may reference images not displayed]

ACR Breast Density Category b: There are scattered areas of
fibroglandular density.
FINDINGS: 2D and 3D full field and spot compression views of the RIGHT breast
demonstrate no persistent abnormality in the area of the screening
study finding.

Mammographic images were processed with CAD.
IMPRESSION: No persistent abnormality in the area of the screening study
finding.

RECOMMENDATION:
Bilateral screening mammograms in 1 year.

I have discussed the findings and recommendations with the patient.
Results were also provided in writing at the conclusion of the
visit. If applicable, a reminder letter will be sent to the patient
regarding the next appointment.

BI-RADS CATEGORY  1: Negative.

## 2021-12-29 ENCOUNTER — Emergency Department (HOSPITAL_COMMUNITY): Payer: No Typology Code available for payment source

## 2021-12-29 ENCOUNTER — Emergency Department (HOSPITAL_COMMUNITY)
Admission: EM | Admit: 2021-12-29 | Discharge: 2021-12-29 | Disposition: A | Discharge status: Home / Self Care | Payer: No Typology Code available for payment source | Attending: Emergency Medicine | Admitting: Emergency Medicine

## 2021-12-29 ENCOUNTER — Other Ambulatory Visit: Payer: Self-pay

## 2021-12-29 ENCOUNTER — Encounter (HOSPITAL_COMMUNITY): Payer: Self-pay

## 2021-12-29 DIAGNOSIS — W109XXA Fall (on) (from) unspecified stairs and steps, initial encounter: Secondary | ICD-10-CM | POA: Insufficient documentation

## 2021-12-29 DIAGNOSIS — S8992XA Unspecified injury of left lower leg, initial encounter: Secondary | ICD-10-CM | POA: Insufficient documentation

## 2021-12-29 DIAGNOSIS — S8991XA Unspecified injury of right lower leg, initial encounter: Secondary | ICD-10-CM | POA: Diagnosis present

## 2021-12-29 DIAGNOSIS — M79671 Pain in right foot: Secondary | ICD-10-CM | POA: Diagnosis not present

## 2021-12-29 MED ORDER — IBUPROFEN 800 MG PO TABS
800.0000 mg | ORAL_TABLET | Freq: Once | ORAL | Status: AC
  Administered 2021-12-29: 17:00:00 800 mg via ORAL

## 2021-12-29 MED ORDER — IBUPROFEN 600 MG PO TABS: 600.0000 mg | ORAL_TABLET | Freq: Four times a day (QID) | ORAL | 0 refills | Status: AC | PRN

## 2021-12-29 MED ORDER — IBUPROFEN 800 MG PO TABS
ORAL_TABLET | ORAL | Status: AC
  Filled 2021-12-29: qty 1

## 2021-12-29 NOTE — Progress Notes (Signed)
Orthopedic Tech Progress Note Patient Details:  Carly Lynch 1970/12/21 [id number]  Ortho Devices Type of Ortho Device: Crutches, Knee Immobilizer Ortho Device/Splint Location: RLE Ortho Device/Splint Interventions: Ordered, Application   Post Interventions Patient Tolerated: Well Instructions Provided: Poper ambulation with device, Adjustment of device  Carly Lynch A Carly Lynch 12/29/2021, 5:10 PM

## 2021-12-29 NOTE — ED Provider Notes (Signed)
[name]   CSN: [id number] Arrival date & time: 12/29/21  1236     History  Chief Complaint  Patient presents with   Knee Injury   Leg Pain   Leg Injury   Knee Pain    Carly Lynch is a 51 y.o. female.  51 year old female presents after mechanical fall that occurred just prior to arrival.  Patient was walking up a step and missed it.  Fell onto her right knee.  Denies any severe hip pain.  No head injury.  Complains of sharp pain at both sides of her knee that is worse with ambulating.  No treatment use prior to arrival      Home Medications Prior to Admission medications   Medication Sig Start Date End Date Taking? Authorizing Provider  hydrochlorothiazide (HYDRODIURIL) 25 MG tablet Take 25 mg by mouth daily.   Yes [provider]  liothyronine (CYTOMEL) 5 MCG tablet Take 2.5 mcg by mouth 2 (two) times daily. 10/25/18  Yes [provider]  Multiple Vitamin (MULTIVITAMIN) capsule Take 1 capsule by mouth daily. centrum   Yes [provider]  TURMERIC PO Take 1 tablet by mouth 2 (two) times daily.   Yes [provider]  albuterol (PROVENTIL HFA;VENTOLIN HFA) 108 (90 BASE) MCG/ACT inhaler Inhale 1 puff into the lungs every 6 (six) hours as needed for wheezing or shortness of breath.    [provider]  Cholecalciferol 125 MCG (5000 UT) TABS Take 1 tablet by mouth every other day.     [provider]  levothyroxine (SYNTHROID, LEVOTHROID) 100 MCG tablet Take 100 mcg by mouth daily before breakfast.     [provider]      Allergies    Ciprofloxin hcl [ciprofloxacin] and Percocet [oxycodone-acetaminophen]    Review of Systems   Review of Systems  All other systems reviewed and are negative.  Physical Exam Updated Vital Signs BP (!) 179/94 (BP Location: Left Arm)    Pulse 74    Temp 98.6 F (37 C) (Oral)    Resp 16    Ht 1.651 m (5\' 5" )    Wt  83 kg    SpO2 99%    BMI 30.45 kg/m  Physical Exam Vitals and nursing note reviewed.  Constitutional:      General: She is not in acute distress.    Appearance: Normal appearance. She is well-developed. She is not toxic-appearing.  HENT:     Head: Normocephalic and atraumatic.  Eyes:     General: Lids are normal.     Conjunctiva/sclera: Conjunctivae normal.     Pupils: Pupils are equal, round, and reactive to light.  Neck:     Thyroid: No thyroid mass.     Trachea: No tracheal deviation.  Cardiovascular:     Rate and Rhythm: Normal rate and regular rhythm.     Heart sounds: Normal heart sounds. No murmur heard.   No gallop.  Pulmonary:     Effort: Pulmonary effort is normal. No respiratory distress.     Breath sounds: Normal breath sounds. No stridor. No decreased breath sounds, wheezing, rhonchi or rales.  Abdominal:     General: There is no distension.     Palpations: Abdomen is soft.     Tenderness: There is no abdominal tenderness. There is no rebound.  Musculoskeletal:        General: No tenderness.     Cervical back: Normal range of motion and neck  supple.     Right knee: No effusion. Decreased range of motion. LCL laxity and MCL laxity present.       Legs:  Skin:    General: Skin is warm and dry.     Findings: No abrasion or rash.  Neurological:     Mental Status: She is alert and oriented to person, place, and time. Mental status is at baseline.     GCS: GCS eye subscore is 4. GCS verbal subscore is 5. GCS motor subscore is 6.     Cranial Nerves: No cranial nerve deficit.     Sensory: No sensory deficit.     Motor: Motor function is intact.  Psychiatric:        Attention and Perception: Attention normal.        Speech: Speech normal.        Behavior: Behavior normal.    ED Results / Procedures / Treatments   Labs (all labs ordered are listed, but only abnormal results are displayed) Labs Reviewed - No data to display  EKG None  Radiology DG Knee  Complete 4 Views Right  Result Date: 12/29/2021 CLINICAL DATA:  Right knee pain EXAM: RIGHT KNEE - COMPLETE 4+ VIEW COMPARISON:  None. FINDINGS: No evidence of fracture, dislocation, or joint effusion. No evidence of arthropathy or other focal bone abnormality. Soft tissues are unremarkable. IMPRESSION: Negative. Electronically Signed   By: Davina Poke D.O.   On: 12/29/2021 14:13   DG Foot Complete Right  Result Date: 12/29/2021 CLINICAL DATA:  Right foot pain EXAM: RIGHT FOOT COMPLETE - 3+ VIEW COMPARISON:  None. FINDINGS: There is no evidence of fracture or dislocation. Tiny bidirectional calcaneal enthesophytes. There is no evidence of arthropathy or other focal bone abnormality. Soft tissues are unremarkable. IMPRESSION: Negative. Electronically Signed   By: Davina Poke D.O.   On: 12/29/2021 14:14    Procedures Procedures    Medications Ordered in ED Medications  ibuprofen (ADVIL) tablet 800 mg (has no administration in time range)    ED Course/ Medical Decision Making/ A&P                           Medical Decision Making Risk Prescription drug management.   X-ray of patient's right knee and right foot are negative here.  Patient will be placed in a knee immobilizer as well as given crutches and orthopedic referral.  Medicate with Motrin here for pain        Final Clinical Impression(s) / ED Diagnoses Final diagnoses:  None    Rx / DC Orders ED Discharge Orders     None         Lacretia Leigh, MD 12/29/21 1642

## 2021-12-29 NOTE — ED Provider Triage Note (Signed)
Emergency Medicine Provider Triage Evaluation Note  Melaney Tellefsen , a 51 y.o. female  was evaluated in triage.  Pt complains of right knee and right great toe pain. She was walking up the stairs today and heard a "pop" in her right knee"  Review of Systems  Positive: arthralgia Negative: Numbness/tingling  Physical Exam  BP (!) 179/94 (BP Location: Left Arm)    Pulse 74    Temp 98.6 F (37 C) (Oral)    Resp 16    SpO2 99%  Gen:   Awake, no distress   Resp:  Normal effort  MSK:   Moves extremities without difficulty  Other:    Medical Decision Making  Medically screening exam initiated at 1:31 PM.  Appropriate orders placed.  Raejean Swinford Jordan-Dorsett was informed that the remainder of the evaluation will be completed by another provider, this initial triage assessment does not replace that evaluation, and the importance of remaining in the ED until their evaluation is complete.  X-rays   Suzy Bouchard, PA-C 12/29/21 1332

## 2021-12-29 NOTE — ED Notes (Signed)
Called ortho tech 

## 2021-12-29 NOTE — ED Notes (Signed)
Patient verbalizes understanding of discharge instructions. Prescriptions and follow-up care reviewed. Opportunity for questioning and answers were provided. Armband removed by staff, pt discharged from ED stable. Ortho placed knee brace on and provided pt with crutches.

## 2021-12-29 NOTE — ED Triage Notes (Signed)
Pt. Stated, I was going up some steps and fell going up. My rt. Leg hurts and I can't even bend my knee

## 2022-02-01 ENCOUNTER — Other Ambulatory Visit: Payer: Self-pay | Admitting: Orthopedic Surgery

## 2022-02-04 ENCOUNTER — Other Ambulatory Visit: Payer: Self-pay

## 2022-02-04 ENCOUNTER — Encounter (HOSPITAL_BASED_OUTPATIENT_CLINIC_OR_DEPARTMENT_OTHER): Payer: Self-pay | Admitting: Orthopedic Surgery

## 2022-02-15 ENCOUNTER — Ambulatory Visit (HOSPITAL_BASED_OUTPATIENT_CLINIC_OR_DEPARTMENT_OTHER): Admit: 2022-02-15 | Payer: BC Managed Care – PPO | Admitting: Orthopedic Surgery

## 2022-02-15 DIAGNOSIS — Z79899 Other long term (current) drug therapy: Secondary | ICD-10-CM

## 2022-02-15 SURGERY — ARTHROSCOPY, KNEE, WITH MENISCUS REPAIR
Anesthesia: Choice | Site: Knee | Laterality: Right

## 2022-02-16 ENCOUNTER — Other Ambulatory Visit: Payer: Self-pay | Admitting: Orthopedic Surgery

## 2022-03-08 ENCOUNTER — Encounter (HOSPITAL_BASED_OUTPATIENT_CLINIC_OR_DEPARTMENT_OTHER): Payer: Self-pay | Admitting: Orthopedic Surgery

## 2022-03-10 ENCOUNTER — Encounter (HOSPITAL_BASED_OUTPATIENT_CLINIC_OR_DEPARTMENT_OTHER)
Admission: RE | Admit: 2022-03-10 | Discharge: 2022-03-10 | Disposition: A | Discharge status: Home / Self Care | Payer: BC Managed Care – PPO | Source: Ambulatory Visit | Attending: Orthopedic Surgery | Admitting: Orthopedic Surgery

## 2022-03-10 DIAGNOSIS — I10 Essential (primary) hypertension: Secondary | ICD-10-CM | POA: Insufficient documentation

## 2022-03-10 DIAGNOSIS — Z01818 Encounter for other preprocedural examination: Secondary | ICD-10-CM | POA: Diagnosis not present

## 2022-03-10 LAB — BASIC METABOLIC PANEL
Anion gap: 10 (ref 5–15)
BUN: 14 mg/dL (ref 6–20)
CO2: 25 mmol/L (ref 22–32)
Calcium: 9.2 mg/dL (ref 8.9–10.3)
Chloride: 103 mmol/L (ref 98–111)
Creatinine, Ser: 0.81 mg/dL (ref 0.44–1.00)
GFR, Estimated: >60 mL/min (ref >60)
Glucose, Bld: 92 mg/dL (ref 70–99)
Potassium: 3.7 mmol/L (ref 3.5–5.1)
Sodium: 138 mmol/L (ref 135–145)

## 2022-03-10 NOTE — Progress Notes (Signed)

## 2022-03-15 ENCOUNTER — Ambulatory Visit (HOSPITAL_BASED_OUTPATIENT_CLINIC_OR_DEPARTMENT_OTHER)
Admission: RE | Admit: 2022-03-15 | Discharge: 2022-03-15 | Disposition: A | Discharge status: Home / Self Care | Payer: BC Managed Care – PPO | Attending: Orthopedic Surgery | Admitting: Orthopedic Surgery

## 2022-03-15 ENCOUNTER — Ambulatory Visit (HOSPITAL_BASED_OUTPATIENT_CLINIC_OR_DEPARTMENT_OTHER): Payer: BC Managed Care – PPO | Admitting: Anesthesiology

## 2022-03-15 ENCOUNTER — Encounter (HOSPITAL_BASED_OUTPATIENT_CLINIC_OR_DEPARTMENT_OTHER)
Admission: RE | Disposition: A | Discharge status: Home / Self Care | Payer: Self-pay | Source: Home / Self Care | Attending: Orthopedic Surgery

## 2022-03-15 ENCOUNTER — Other Ambulatory Visit: Payer: Self-pay

## 2022-03-15 ENCOUNTER — Encounter (HOSPITAL_BASED_OUTPATIENT_CLINIC_OR_DEPARTMENT_OTHER): Payer: Self-pay | Admitting: Orthopedic Surgery

## 2022-03-15 DIAGNOSIS — S83231A Complex tear of medial meniscus, current injury, right knee, initial encounter: Secondary | ICD-10-CM | POA: Insufficient documentation

## 2022-03-15 DIAGNOSIS — F32A Depression, unspecified: Secondary | ICD-10-CM | POA: Insufficient documentation

## 2022-03-15 DIAGNOSIS — X58XXXA Exposure to other specified factors, initial encounter: Secondary | ICD-10-CM | POA: Diagnosis not present

## 2022-03-15 DIAGNOSIS — F419 Anxiety disorder, unspecified: Secondary | ICD-10-CM | POA: Diagnosis not present

## 2022-03-15 DIAGNOSIS — J45909 Unspecified asthma, uncomplicated: Secondary | ICD-10-CM | POA: Diagnosis not present

## 2022-03-15 DIAGNOSIS — I10 Essential (primary) hypertension: Secondary | ICD-10-CM | POA: Insufficient documentation

## 2022-03-15 DIAGNOSIS — Z79899 Other long term (current) drug therapy: Secondary | ICD-10-CM | POA: Diagnosis not present

## 2022-03-15 HISTORY — PX: KNEE ARTHROSCOPY WITH MENISCAL REPAIR: SHX5653

## 2022-03-15 SURGERY — ARTHROSCOPY, KNEE, WITH MENISCUS REPAIR
Anesthesia: General | Site: Knee | Laterality: Right

## 2022-03-15 MED ORDER — ROCURONIUM BROMIDE 10 MG/ML (PF) SYRINGE
PREFILLED_SYRINGE | INTRAVENOUS | Status: AC
  Filled 2022-03-15: qty 10

## 2022-03-15 MED ORDER — PROPOFOL 10 MG/ML IV BOLUS
INTRAVENOUS | Status: DC | PRN
  Administered 2022-03-15: 200 mg via INTRAVENOUS

## 2022-03-15 MED ORDER — FENTANYL CITRATE (PF) 100 MCG/2ML IJ SOLN
INTRAMUSCULAR | Status: AC
  Filled 2022-03-15: qty 2

## 2022-03-15 MED ORDER — SODIUM CHLORIDE 0.9 % IR SOLN
Status: DC | PRN
  Administered 2022-03-15: 3000 mL

## 2022-03-15 MED ORDER — HYDROCODONE-ACETAMINOPHEN 7.5-325 MG PO TABS: 1.0000 | ORAL_TABLET | Freq: Once | ORAL | Status: DC | PRN

## 2022-03-15 MED ORDER — ONDANSETRON 4 MG PO TBDP
4.0000 mg | ORAL_TABLET | Freq: Once | ORAL | Status: AC
Start: 2022-03-15 — End: 2022-03-15
  Administered 2022-03-15: 4 mg via ORAL

## 2022-03-15 MED ORDER — LACTATED RINGERS IV SOLN: INTRAVENOUS | Status: DC

## 2022-03-15 MED ORDER — LIDOCAINE HCL (CARDIAC) PF 100 MG/5ML IV SOSY
PREFILLED_SYRINGE | INTRAVENOUS | Status: DC | PRN
  Administered 2022-03-15: 60 mg via INTRAVENOUS

## 2022-03-15 MED ORDER — ROPIVACAINE HCL 5 MG/ML IJ SOLN
INTRAMUSCULAR | Status: DC | PRN
  Administered 2022-03-15: 30 mL

## 2022-03-15 MED ORDER — ONDANSETRON 4 MG PO TBDP
ORAL_TABLET | ORAL | Status: AC
  Filled 2022-03-15: qty 1

## 2022-03-15 MED ORDER — MIDAZOLAM HCL 2 MG/2ML IJ SOLN
INTRAMUSCULAR | Status: AC
Start: 2022-03-15 — End: ?
  Filled 2022-03-15: qty 2

## 2022-03-15 MED ORDER — PROPOFOL 500 MG/50ML IV EMUL
INTRAVENOUS | Status: AC
  Filled 2022-03-15: qty 50

## 2022-03-15 MED ORDER — TRAMADOL HCL 50 MG PO TABS: 50.0000 mg | ORAL_TABLET | Freq: Four times a day (QID) | ORAL | 0 refills | Status: AC | PRN

## 2022-03-15 MED ORDER — AMISULPRIDE (ANTIEMETIC) 5 MG/2ML IV SOLN: 10.0000 mg | Freq: Once | INTRAVENOUS | Status: DC | PRN

## 2022-03-15 MED ORDER — KETOROLAC TROMETHAMINE 30 MG/ML IJ SOLN
30.0000 mg | Freq: Once | INTRAMUSCULAR | Status: AC | PRN
  Administered 2022-03-15: 30 mg via INTRAVENOUS

## 2022-03-15 MED ORDER — HYDROMORPHONE HCL 1 MG/ML IJ SOLN
0.2500 mg | INTRAMUSCULAR | Status: DC | PRN
  Administered 2022-03-15: 0.5 mg via INTRAVENOUS

## 2022-03-15 MED ORDER — ONDANSETRON HCL 4 MG/2ML IJ SOLN
INTRAMUSCULAR | Status: DC | PRN
  Administered 2022-03-15: 4 mg via INTRAVENOUS

## 2022-03-15 MED ORDER — ACETAMINOPHEN 500 MG PO TABS
ORAL_TABLET | ORAL | Status: AC
  Filled 2022-03-15: qty 2

## 2022-03-15 MED ORDER — PHENYLEPHRINE 40 MCG/ML (10ML) SYRINGE FOR IV PUSH (FOR BLOOD PRESSURE SUPPORT)
PREFILLED_SYRINGE | INTRAVENOUS | Status: AC
  Filled 2022-03-15: qty 20

## 2022-03-15 MED ORDER — MIDAZOLAM HCL 5 MG/5ML IJ SOLN
INTRAMUSCULAR | Status: DC | PRN
Start: 2022-03-15 — End: 2022-03-15
  Administered 2022-03-15: 2 mg via INTRAVENOUS

## 2022-03-15 MED ORDER — CEFAZOLIN SODIUM-DEXTROSE 2-4 GM/100ML-% IV SOLN
INTRAVENOUS | Status: AC
Start: 2022-03-15 — End: ?
  Filled 2022-03-15: qty 100

## 2022-03-15 MED ORDER — HYDROMORPHONE HCL 1 MG/ML IJ SOLN
INTRAMUSCULAR | Status: AC
  Filled 2022-03-15: qty 0.5

## 2022-03-15 MED ORDER — ACETAMINOPHEN 500 MG PO TABS
1000.0000 mg | ORAL_TABLET | Freq: Once | ORAL | Status: AC
  Administered 2022-03-15: 1000 mg via ORAL

## 2022-03-15 MED ORDER — FENTANYL CITRATE (PF) 100 MCG/2ML IJ SOLN
INTRAMUSCULAR | Status: DC | PRN
Start: 2022-03-15 — End: 2022-03-15
  Administered 2022-03-15: 25 ug via INTRAVENOUS
  Administered 2022-03-15: 50 ug via INTRAVENOUS
  Administered 2022-03-15 (×2): 25 ug via INTRAVENOUS

## 2022-03-15 MED ORDER — HYDROCODONE-ACETAMINOPHEN 5-325 MG PO TABS
ORAL_TABLET | ORAL | Status: AC
  Filled 2022-03-15: qty 1

## 2022-03-15 MED ORDER — KETOROLAC TROMETHAMINE 30 MG/ML IJ SOLN
INTRAMUSCULAR | Status: AC
  Filled 2022-03-15: qty 1

## 2022-03-15 MED ORDER — CEFAZOLIN SODIUM-DEXTROSE 2-4 GM/100ML-% IV SOLN
2.0000 g | INTRAVENOUS | Status: AC
  Administered 2022-03-15: 2 g via INTRAVENOUS

## 2022-03-15 MED ORDER — HYDROCODONE-ACETAMINOPHEN 5-325 MG PO TABS
1.0000 | ORAL_TABLET | Freq: Once | ORAL | Status: AC
  Administered 2022-03-15: 1 via ORAL

## 2022-03-15 MED ORDER — PROPOFOL 500 MG/50ML IV EMUL
INTRAVENOUS | Status: DC | PRN
  Administered 2022-03-15: 35 ug/kg/min via INTRAVENOUS

## 2022-03-15 MED ORDER — KETAMINE HCL 50 MG/5ML IJ SOSY
PREFILLED_SYRINGE | INTRAMUSCULAR | Status: AC
  Filled 2022-03-15: qty 5

## 2022-03-15 MED ORDER — DEXAMETHASONE SODIUM PHOSPHATE 10 MG/ML IJ SOLN
INTRAMUSCULAR | Status: DC | PRN
  Administered 2022-03-15: 4 mg via INTRAVENOUS

## 2022-03-15 MED ORDER — ONDANSETRON HCL 4 MG/2ML IJ SOLN: 4.0000 mg | Freq: Once | INTRAMUSCULAR | Status: DC | PRN

## 2022-03-15 MED ORDER — PROMETHAZINE HCL 12.5 MG PO TABS: 12.5000 mg | ORAL_TABLET | Freq: Four times a day (QID) | ORAL | 0 refills | Status: AC | PRN

## 2022-03-15 SURGICAL SUPPLY — 56 items
APL PRP STRL LF DISP 70% ISPRP (MISCELLANEOUS) ×1
BANDAGE ESMARK 6X9 LF (GAUZE/BANDAGES/DRESSINGS) IMPLANT
BLADE CLIPPER SURG (BLADE) IMPLANT
BLADE EXCALIBUR 4.0X13 (MISCELLANEOUS) IMPLANT
BLADE SHAVER BONE 5.0X13 (MISCELLANEOUS) IMPLANT
BLADE SURG 15 STRL LF DISP TIS (BLADE) IMPLANT
BLADE SURG 15 STRL SS (BLADE)
BNDG CMPR 9X6 STRL LF SNTH (GAUZE/BANDAGES/DRESSINGS)
BNDG COHESIVE 4X5 TAN ST LF (GAUZE/BANDAGES/DRESSINGS) ×2 IMPLANT
BNDG ELASTIC 6X5.8 VLCR STR LF (GAUZE/BANDAGES/DRESSINGS) ×2 IMPLANT
BNDG ESMARK 6X9 LF (GAUZE/BANDAGES/DRESSINGS)
BURR OVAL 8 FLU 4.0X13 (MISCELLANEOUS) IMPLANT
CHLORAPREP W/TINT 26 (MISCELLANEOUS) ×2 IMPLANT
CUFF TOURN SGL QUICK 34 (TOURNIQUET CUFF) ×2
CUFF TRNQT CYL 34X4.125X (TOURNIQUET CUFF) ×1 IMPLANT
CUTTER BONE 4.0MM X 13CM (MISCELLANEOUS) ×1 IMPLANT
CUTTER TENSIONER SUT 2-0 0 FBW (INSTRUMENTS) IMPLANT
DISSECTOR 3.5MM X 13CM CVD (MISCELLANEOUS) IMPLANT
DISSECTOR 4.0MMX13CM CVD (MISCELLANEOUS) ×2 IMPLANT
DRAPE EXTREMITY T 121X128X90 (DISPOSABLE) ×2 IMPLANT
DRAPE IMP U-DRAPE 54X76 (DRAPES) IMPLANT
DRAPE INCISE IOBAN 66X45 STRL (DRAPES) ×2 IMPLANT
DRAPE U-SHAPE 47X51 STRL (DRAPES) ×2 IMPLANT
DW OUTFLOW CASSETTE/TUBE SET (MISCELLANEOUS) ×2 IMPLANT
ELECT REM PT RETURN 9FT ADLT (ELECTROSURGICAL) ×2
ELECTRODE REM PT RTRN 9FT ADLT (ELECTROSURGICAL) ×1 IMPLANT
FIBERSTICK 2 (SUTURE) IMPLANT
GAUZE SPONGE 4X4 12PLY STRL (GAUZE/BANDAGES/DRESSINGS) IMPLANT
GLOVE BIOGEL PI IND STRL 8 (GLOVE) ×1 IMPLANT
GLOVE BIOGEL PI INDICATOR 8 (GLOVE) ×1
GLOVE ECLIPSE 8.0 STRL XLNG CF (GLOVE) ×4 IMPLANT
GLOVE SURG ORTHO LTX SZ7.5 (GLOVE) ×2 IMPLANT
GOWN STRL REUS W/ TWL LRG LVL3 (GOWN DISPOSABLE) ×1 IMPLANT
GOWN STRL REUS W/TWL LRG LVL3 (GOWN DISPOSABLE) ×2
GOWN STRL REUS W/TWL XL LVL3 (GOWN DISPOSABLE) ×2 IMPLANT
KIT TURNOVER KIT B (KITS) ×2 IMPLANT
MANIFOLD NEPTUNE II (INSTRUMENTS) ×2 IMPLANT
NDL SAFETY ECLIPSE 18X1.5 (NEEDLE) ×1 IMPLANT
NEEDLE HYPO 18GX1.5 SHARP (NEEDLE) ×2
NS IRRIG 1000ML POUR BTL (IV SOLUTION) IMPLANT
PACK ARTHROSCOPY DSU (CUSTOM PROCEDURE TRAY) ×2 IMPLANT
PADDING CAST COTTON 6X4 STRL (CAST SUPPLIES) IMPLANT
PENCIL SMOKE EVACUATOR (MISCELLANEOUS) IMPLANT
PORT APPOLLO RF 90DEGREE MULTI (SURGICAL WAND) IMPLANT
SHEET MEDIUM DRAPE 40X70 STRL (DRAPES) ×2 IMPLANT
SLEEVE SCD COMPRESS KNEE MED (STOCKING) ×2 IMPLANT
SPONGE T-LAP 18X18 ~~LOC~~+RFID (SPONGE) IMPLANT
STRIP CLOSURE SKIN 1/2X4 (GAUZE/BANDAGES/DRESSINGS) ×2 IMPLANT
SUT MNCRL AB 3-0 PS2 27 (SUTURE) ×2 IMPLANT
SUT MON AB 2-0 CT1 36 (SUTURE) ×2 IMPLANT
SUT PDS AB 1 CT  36 (SUTURE)
SUT PDS AB 1 CT 36 (SUTURE) IMPLANT
SYR 5ML LL (SYRINGE) ×2 IMPLANT
TOWEL GREEN STERILE FF (TOWEL DISPOSABLE) ×4 IMPLANT
TUBE CONNECTING 20X1/4 (TUBING) IMPLANT
TUBING ARTHROSCOPY IRRIG 16FT (MISCELLANEOUS) ×2 IMPLANT

## 2022-03-15 NOTE — Anesthesia Postprocedure Evaluation (Addendum)
Anesthesia Post Note ? ?Patient: Carly Lynch ? ?Procedure(s) Performed: RIGHT KNEE PARTIAL MEDIAL MENISCECTOMY (Right: Knee) ? ?  ? ?Patient location during evaluation: PACU ?Anesthesia Type: General ?Level of consciousness: awake and alert, oriented and patient cooperative ?Pain management: pain level controlled ?Vital Signs Assessment: post-procedure vital signs reviewed and stable ?Respiratory status: spontaneous breathing, nonlabored ventilation and respiratory function stable ?Cardiovascular status: blood pressure returned to baseline and stable ?Postop Assessment: no apparent nausea or vomiting ?Anesthetic complications: no ? ? ?No notable events documented. ? ?Last Vitals:  ?  ?Last Pain:  ?Vitals:  ? 03/15/22 1130  ?TempSrc:   ?PainSc: 7   ? ? ?  ?  ?  ?  ?  ?  ? ?Jarome Matin Crescentia Boutwell ? ? ? ? ?

## 2022-03-15 NOTE — Transfer of Care (Signed)
Immediate Anesthesia Transfer of Care Note ? ?Patient: Carly Lynch ? ?Procedure(s) Performed: RIGHT KNEE PARTIAL MEDIAL MENISCECTOMY (Right: Knee) ? ?Patient Location: PACU ? ?Anesthesia Type:General ? ?Level of Consciousness: drowsy and patient cooperative ? ?Airway & Oxygen Therapy: Patient Spontanous Breathing and Patient connected to face mask oxygen ? ?Post-op Assessment: Report given to RN and Post -op Vital signs reviewed and stable ? ?Post vital signs: Reviewed and stable ? ?Last Vitals:  ?Vitals Value Taken Time  ?BP    ?Temp    ?Pulse    ?Resp    ?SpO2    ? ? ?Last Pain:  ?Vitals:  ? 03/15/22 0816  ?TempSrc: Oral  ?PainSc: 6   ?   ? ?Patients Stated Pain Goal: 4 (03/15/22 0816) ? ?Complications: No notable events documented. ?

## 2022-03-15 NOTE — H&P (Signed)
PREOPERATIVE H&P ? ?HPI: ?Carly Lynch is a 51 y.o. female who has presented today for surgery, with the diagnosis of right knee medial meniscus tear.  This occurred as a result of an injury sustained when she was walking on the stairs and felt a pop in her knee, resulting in immediate pain and swelling.  She has had mechanical symptoms including locking, catching, and popping localizing to the medial joint line ever since the injury.  She experience persistence of symptoms despite anti-inflammatory medication and activity modification.  An MRI demonstrated a complex tear of the body of the medial meniscus with an associated meniscal comma sign, consistent with a flipped fragment in the meniscotibial recess.  This corresponded with the area of the patient's pain and symptoms on examination clinically.  The various methods of treatment have been discussed with the patient and family.  After consideration of risks, benefits, and other options for treatment, the patient has consented to [place] as a surgical intervention.  The patient's history has been reviewed, patient examined, no change in status, stable for surgery.  I have reviewed the patient's chart and labs.  Questions were answered to the patient's satisfaction.   ? ?PMH: ?Past Medical History:  ?Diagnosis Date  ? Anxiety   ? Asthma   ? has inhaler  ? Depression   ? Fibroid   ? Gestational diabetes   ? Goiter   ? Headache(784.0)   ? h/o migraines  ? Heart murmur   ? Hypertension   ? no meds- being monitored  ? Thyroid nodule   ? ? ?Home Medications Allergies  ?No current facility-administered medications on file prior to encounter.  ? ?Current Outpatient Medications on File Prior to Encounter  ?Medication Sig Dispense Refill  ? Cholecalciferol 125 MCG (5000 UT) TABS Take 1 tablet by mouth every other day.     ? ibuprofen (ADVIL) 600 MG tablet Take 1 tablet (600 mg total) by mouth every 6 (six) hours as needed. 30  tablet 0  ? levothyroxine (SYNTHROID, LEVOTHROID) 100 MCG tablet Take 88 mcg by mouth daily before breakfast.    ? liothyronine (CYTOMEL) 5 MCG tablet Take 2.5 mcg by mouth 2 (two) times daily.  11  ? lisinopril-hydrochlorothiazide (ZESTORETIC) 20-25 MG tablet Take 1 tablet by mouth daily.    ? Multiple Vitamin (MULTIVITAMIN) capsule Take 1 capsule by mouth daily. centrum    ? TURMERIC PO Take 1 tablet by mouth 2 (two) times daily.    ? UNABLE TO FIND Med Name: liothyronine sodium (T3 SR 50% Methocel)    ? albuterol (PROVENTIL HFA;VENTOLIN HFA) 108 (90 BASE) MCG/ACT inhaler Inhale 1 puff into the lungs every 6 (six) hours as needed for wheezing or shortness of breath.    ? Allergies  ?Allergen Reactions  ? Ciprofloxin Hcl [Ciprofloxacin] Hives  ? Percocet [Oxycodone-Acetaminophen] Hives  ?  Codeine ok  ?  ? ?PSH: ?Past Surgical History:  ?Procedure Laterality Date  ? ABDOMINAL HYSTERECTOMY N/A 02/13/2013  ? Procedure: HYSTERECTOMY ABDOMINAL;  Surgeon: Gus Height, MD;  Location: [facility];  Service: Gynecology;  Laterality: N/A;  ? CYST EXCISION    ? x2 behind ear  ? INDUCED ABORTION    ? x3  ? THYROIDECTOMY    ? TONSILLECTOMY    ? WISDOM TOOTH EXTRACTION    ?  ? ?Family History Social History  ?Family History  ?Problem Relation Age of Onset  ? Hypertension Mother   ? Depression Mother   ?  Hypertension Father   ? Hyperthyroidism Father   ? Hyperthyroidism Paternal Aunt   ? Cancer Maternal Grandmother   ?     colon  ? Hyperthyroidism Paternal Grandmother   ?  Social History  ? ?Socioeconomic History  ? Marital status: Married  ?  Spouse name: Not on file  ? Number of children: Not on file  ? Years of education: Not on file  ? Highest education level: Not on file  ?Occupational History  ? Not on file  ?Tobacco Use  ? Smoking status: Never  ? Smokeless tobacco: Never  ?Substance and Sexual Activity  ? Alcohol use: Yes  ?  Comment: rare  ? Drug use: No  ? Sexual activity: Not Currently  ?Other Topics Concern  ? Not on file   ?Social History Narrative  ? Not on file  ? ?Social Determinants of Health  ? ?Financial Resource Strain: Not on file  ?Food Insecurity: Not on file  ?Transportation Needs: Not on file  ?Physical Activity: Not on file  ?Stress: Not on file  ?Social Connections: Not on file  ?  ? ?Review of Systems: ?MSK: As noted per HPI above ?GI: No current Nausea/vomiting ?ENT: Denies sore throat, epistaxis ?CV: Denies chest pain ?Resp: No current shortness of breath ? ?Other than mentioned above, there are no Constitutional, Neurological, Psychiatric, ENT, Ophthalmological, Cardiovascular, Respiratory, GI, GU, Musculoskeletal, Integumentary, Lymphatic, Endocrine or Allergic issues.  ? ?Physical Examination: ?CV: Normal distal pulses ?Lungs: Unlabored respirations ?RLE: Examination of the right knee demonstrates trace effusion.  Range of motion 0 degrees to over 135 degrees.  Tenderness over the medial joint line, corresponding to the area of the meniscal comma sign on MRI.  Positive medial McMurray's.  Pain and reproduction of symptoms with hyperflexion, Steinmann test, Apley, and Thessaly tests.  Stable to varus and valgus stress in 30 degrees and 0 degrees.  Stable Lachman.  Stable anterior drawer and posterior drawer.  No patellofemoral tenderness.  Normal quadriceps activation without inhibition.  Normal dorsiflexion, plantarflexion, and EHL function and strength.  Sensation intact light touch in the superficial peroneal, deep peroneal, and tibial distributions.  Normal distal pulses.  Warm and well-perfused distally. ? ?Assessment/Plan: ?RIGHT KNEE PARTIAL MEDIAL MENISCECTOMY  ? ? ?Georgeanna Harrison M.D. ?Orthopaedic Surgery ?[facility] ? ?Review of this patient's medications prescribed by other providers does not in any way constitute an endorsement by this clinician of their use, indications, dosage, route, efficacy, interactions, or other clinical parameters. ? ?Portions of the record have  been created with voice recognition software.  Grammatical and punctuation errors, random word insertions, wrong-word or "sound-a-like" substitutions, pronoun errors (inaccuracies and/or substitutions), and/or incomplete sentences may have occurred due to the inherent limitations of voice recognition software.  Not all errors are caught or corrected.  Although every attempt is made to root out erroneous and incomplete transcription, the note may still not fully represent the intent or opinion of the author.  Read the chart carefully and recognize, using context, where errors/substitutions have occurred.  Any questions or concerns about the content of this note or information contained within the body of this dictation should be addressed directly with the author for clarification.   ?

## 2022-03-15 NOTE — Op Note (Signed)
OPERATIVE NOTE ? ?Carly Lynch ?female ?51 y.o. ?03/15/2022 ? ?PREOPERATIVE DIAGNOSIS: ?Right knee traumatic complex medial meniscus tear (I34.742V) ? ?POSTOPERATIVE DIAGNOSIS: ?Right knee traumatic complex medial meniscus tear (Z56.387F) ? ?PROCEDURE(S): ?Right knee arthroscopy with partial medial meniscectomy (64332) ? ?SURGEON: Georgeanna Harrison, M.D. ? ?ASSISTANT(S): None ? ?ANESTHESIA: General ? ?FINDINGS: ?Preoperative Examination: ?Examination of the right knee demonstrates trace effusion.  Range of motion 0 degrees to over 135 degrees.  Tenderness over the medial joint line, corresponding to the area of the meniscal comma sign on MRI.  Positive medial McMurray's.  Pain and reproduction of symptoms with hyperflexion, Steinmann test, Apley, and Thessaly tests.  Stable to varus and valgus stress in 30 degrees and 0 degrees.  Stable Lachman.  Stable anterior drawer and posterior drawer.  No patellofemoral tenderness.  Normal quadriceps activation without inhibition.  Normal dorsiflexion, plantarflexion, and EHL function and strength.  Sensation intact light touch in the superficial peroneal, deep peroneal, and tibial distributions.  Normal distal pulses.  Warm and well-perfused distally. ? ?Operative Findings: ?Arthroscopic examination of the knee revealed the following: ?Medial compartment: Complex tear of the body of the medial meniscus with flipped fragment in the meniscotibial recess.  Cartilage intact. ?Intercondylar notch: ACL intact.  PCL intact. ?Lateral compartment: Lateral meniscus intact.  Cartilage intact. ?Lateral gutter: No synovitis.  No loose bodies. ?Patellofemoral compartment: Patellar cartilage intact.  Trochlear cartilage intact. ?Suprapatellar pouch: No synovitis.  No loose bodies. ?Medial gutter: No synovitis.  No loose bodies. ? ?IMPLANTS: ?* No implants in log * ? ?INDICATIONS:  ?The patient is a 51 y.o. female with a medial meniscus tear resulting from a traumatic injury. This  occurred as a result of an injury sustained when she was walking on the stairs and felt a pop in her knee, resulting in immediate pain and swelling.  She has had mechanical symptoms including locking, catching, and popping localizing to the medial joint line ever since the injury.  She experience persistence of symptoms despite anti-inflammatory medication and activity modification.  An MRI demonstrated a complex tear of the body of the medial meniscus with an associated meniscal comma sign, consistent with a flipped fragment in the meniscotibial recess.  This corresponded with the area of the patient's pain and symptoms on examination clinically.  She understood the risks, benefits and alternatives to surgery which include but are not limited to bleeding, wound healing complications, infection, damage to surrounding structures, persistent pain, stiffness, lack of improvement, potential for subsequent arthritis or worsening of pre-existing arthritis, and need for further surgery, as well as complications related to anesthesia, cardiovascular complications, and death.  She also understood the potential for continued pain in that there were no guarantees of acceptable outcome.  After weighing these risks the patient opted to proceed with surgery. ? ?TECHNIQUE: ?Patient was identified in the preoperative holding area.  The right knee was marked by myself.  Consent was signed by myself and the patient.  No block was performed by anesthesia in the preoperative holding area.  Patient was taken to the operative suite and placed supine on the operative table.  Anesthesia was induced by the anesthesia team.  The patient was positioned appropriately for the procedure and all bony prominences were well padded.  A non-sterile thigh tourniquet was placed on the operative extremity.  Preoperative antibiotics were given. The extremity was prepped and draped in the usual sterile fashion and surgical timeout was performed.  Surface  anatomy was marked out on the front of  the knee.  Tourniquet was not used during this procedure.  Standard lateral infrapatellar portal was established.  Medial infrapatellar portal was established under arthroscopic visualization using spinal needle localization.  Diagnostic arthroscopy was performed with findings as noted.  The tear was readily identified in the body of the medial meniscus, with a flipped flap tear in the meniscotibial recess.  The flap tear was pulled out of the joint with the probe, and was thoroughly debrided with combination of meniscal biters and sucker shaver, achieving a stable healthy rim of meniscal tissue.  The knee was copiously irrigated and suctioned of all arthroscopic fluid.  Portals were closed with simple inverted 2-0 Monocryl and dressed with Steri-Strips.  20 cc of ropivacaine was injected intra-articular, with an additional 5 cc placed in each of the portal sites.  Dry sterile dressing was placed, followed by ice pack.  Patient was awakened from anesthesia and transferred to PACU in stable condition.  She tolerated procedure well.  There were no complications. ? ?POST OPERATIVE INSTRUCTIONS: ?Mobility: Crutches for ambulation as needed, may discontinue when able to ambulate without crutches. ?Pain control: Continue to wean/titrate to appropriate oral regimen ?DVT Prophylaxis: Aspirin 81 mg twice daily for 2 weeks ?RLE: Weightbearing as tolerated ?Disposition: Home ?Dressing care:  Remove brace (if present), Ace wrap, and gauze dressing 48 hours after surgery.  Do not remove paper tapes (Steri-Strips) or any suture material.  Steri-Strips may eventually fall off on their own.  Reapply fresh gauze and Ace wrap.  Wrap lightly over wounds.  Reapply brace if present. ?Follow-up: Please call [facility] [phone]) to schedule follow-op appointment for 2 weeks after surgery. ? ?TOURNIQUET TIME: N/A ? ?BLOOD LOSS: 5 mL ?        ?DRAINS: none ?         ?SPECIMEN: none ?      ?COMPLICATIONS:  * No complications entered in OR log * ?        ?DISPOSITION: PACU - hemodynamically stable. ?        ?CONDITION: stable  ? ?Georgeanna Harrison M.D. ?Orthopaedic Surgery ?[facility] ? ? ?Portions of the record have been created with voice recognition software.  Grammatical and punctuation errors, random word insertions, wrong-word or "sound-a-like" substitutions, pronoun errors (inaccuracies and/or substitutions), and/or incomplete sentences may have occurred due to the inherent limitations of voice recognition software.  Not all errors are caught or corrected.  Although every attempt is made to root out erroneous and incomplete transcription, the note may still not fully represent the intent or opinion of the author.  Read the chart carefully and recognize, using context, where errors/substitutions have occurred.  Any questions or concerns about the content of this note or information contained within the body of this dictation should be addressed directly with the author for clarification.  ?

## 2022-03-15 NOTE — Discharge Instructions (Addendum)
Discharge instructions for Dr. Georgeanna Harrison, M.D.: Please refer to the two-sided discharge instructions paper that Dr. Mable Fill placed in the patient's paper chart. Please give this to the patient to take home after reviewing with the patient!!  ? ?General discharge instructions: ? ?PLEASE REFER TO TWO-SIDED PAPER INSTRUCTIONS IN PAPER CHART FOR SPECIFIC INSTRUCTIONS!!! ? ?Diet: As you were doing prior to hospitalization. ?Shower:  Unless otherwise specified (i.e. on two-sided paper instructions with paper chart) may shower but keep the wounds dry, use an occlusive plastic wrap, NO SOAKING IN TUB.  If the bandage gets wet, change with a clean dry gauze. ?Dressing:  Unless otherwise specified (i.e. on two-sided paper instructions with paper chart), may change your dressing 3-5 days after surgery.  Then change the dressing daily with sterile gauze dressing.  If there are sticky tapes (steri-strips) on your wounds and all the stitches are absorbable.  Leave the steri-strips in place when changing your dressings, they will peel off with time, usually 2-3 weeks. ?Activity:  Increase activity slowly as tolerated, but follow the restrictions on the two-sided paper discharge instructions sheet that Dr. Mable Fill placed in the paper chart.  No lifting or driving for 6 weeks. ?Weight Bearing: WEIGHTBEARING AS TOLERATED (WBAT) on the RIGHT LOWER EXTREMITY. ?To prevent constipation: You may use over-the-counter stool softener(s) such as Colace (over the counter) 100 mg by mouth twice a day and/or Miralax (over the counter) for constipation as needed.  Drink plenty of fluids (prune juice may be helpful) and high fiber foods.  ?Itching:  If you experience itching with your medications, try taking only a single pain pill, or even half a pain pill at a time.  You can also use benadryl over the counter for itching or also to help with sleep.  ?Precautions:  If you experience chest pain or shortness of breath - call 911 immediately for  transfer to the hospital emergency department!! ? ?PLEASE REFER TO TWO-SIDED PAPER INSTRUCTIONS IN PAPER CHART FOR SPECIFIC INSTRUCTIONS!!! ? ?If you develop a fever greater that 101.1 deg F, purulent drainage from wound, increased redness or drainage from wound, or calf pain -- Call the office at [phone].  ? ? ?Post Anesthesia Home Care Instructions ? ?Activity: ?Get plenty of rest for the remainder of the day. A responsible individual must stay with you for 24 hours following the procedure.  ?For the next 24 hours, DO NOT: ?-Drive a car ?-Paediatric nurse ?-Drink alcoholic beverages ?-Take any medication unless instructed by your physician ?-Make any legal decisions or sign important papers. ? ?Meals: ?Start with liquid foods such as gelatin or soup. Progress to regular foods as tolerated. Avoid greasy, spicy, heavy foods. If nausea and/or vomiting occur, drink only clear liquids until the nausea and/or vomiting subsides. Call your physician if vomiting continues. ? ?Special Instructions/Symptoms: ?Your throat may feel dry or sore from the anesthesia or the breathing tube placed in your throat during surgery. If this causes discomfort, gargle with warm salt water. The discomfort should disappear within 24 hours. ? ?If you had a scopolamine patch placed behind your ear for the management of post- operative nausea and/or vomiting: ? ?1. The medication in the patch is effective for 72 hours, after which it should be removed.  Wrap patch in a tissue and discard in the trash. Wash hands thoroughly with soap and water. ?2. You may remove the patch earlier than 72 hours if you experience unpleasant side effects which may include dry mouth, dizziness or visual disturbances. ?  3. Avoid touching the patch. Wash your hands with soap and water after contact with the patch. ?    ?

## 2022-03-15 NOTE — Anesthesia Procedure Notes (Signed)
Procedure Name: LMA Insertion ?Date/Time: 03/15/2022 10:30 AM ?Performed by: Signe Colt, CRNA ?Pre-anesthesia Checklist: Patient identified, Emergency Drugs available, Suction available and Patient being monitored ?Patient Re-evaluated:Patient Re-evaluated prior to induction ?Oxygen Delivery Method: Circle System Utilized ?Preoxygenation: Pre-oxygenation with 100% oxygen ?Induction Type: IV induction ?Ventilation: Mask ventilation without difficulty ?LMA: LMA inserted ?LMA Size: 4.0 ?Number of attempts: 1 ?Airway Equipment and Method: bite block ?Placement Confirmation: positive ETCO2 ?Tube secured with: Tape ?Dental Injury: Teeth and Oropharynx as per pre-operative assessment  ? ? ? ? ?

## 2022-03-15 NOTE — Anesthesia Preprocedure Evaluation (Addendum)
Anesthesia Evaluation  ?Patient identified by MRN, date of birth, ID band ?Patient awake ? ? ? ?Reviewed: ?Allergy & Precautions, NPO status , Patient's Chart, lab work & pertinent test results ? ?Airway ?Mallampati: III ? ?TM Distance: >3 FB ?Neck ROM: Full ? ? ? Dental ? ?(+) Teeth Intact, Dental Advisory Given, Chipped,  ?  ?Pulmonary ?asthma (has been wheezing recently, doesnt use rescue inhaler very often- only w/ URIs) ,  ?Snores at night, sometimes wakes up out of her sleep gasping, hasn't had her sleep study yet  ?  ?Pulmonary exam normal ?breath sounds clear to auscultation ? ? ? ? ? ? Cardiovascular ?hypertension, Pt. on medications ?Normal cardiovascular exam ?Rhythm:Regular Rate:Normal ? ? ?  ?Neuro/Psych ? Headaches, PSYCHIATRIC DISORDERS Anxiety Depression   ? GI/Hepatic ?negative GI ROS, Neg liver ROS,   ?Endo/Other  ?neg diabetesBMI 32 ? Renal/GU ?negative Renal ROS  ?negative genitourinary ?  ?Musculoskeletal ?negative musculoskeletal ROS ?(+)  ? Abdominal ?  ?Peds ? Hematology ?negative hematology ROS ?(+)   ?Anesthesia Other Findings ? ? Reproductive/Obstetrics ?negative OB ROS ? ?  ? ? ? ? ? ? ? ? ? ? ? ? ? ?  ?  ? ? ? ? ? ? ? ?Anesthesia Physical ?Anesthesia Plan ? ?ASA: 2 ? ?Anesthesia Plan: General  ? ?Post-op Pain Management: Tylenol PO (pre-op)* and Toradol IV (intra-op)*  ? ?Induction: Intravenous ? ?PONV Risk Score and Plan: 3 and Ondansetron, Dexamethasone, Midazolam and Treatment may vary due to age or medical condition ? ?Airway Management Planned: LMA ? ?Additional Equipment: None ? ?Intra-op Plan:  ? ?Post-operative Plan: Extubation in OR ? ?Informed Consent: I have reviewed the patients History and Physical, chart, labs and discussed the procedure including the risks, benefits and alternatives for the proposed anesthesia with the patient or authorized representative who has indicated his/her understanding and acceptance.  ? ? ? ?Dental advisory  given ? ?Plan Discussed with: CRNA ? ?Anesthesia Plan Comments:   ? ? ? ? ? ?Anesthesia Quick Evaluation ? ?

## 2022-03-16 ENCOUNTER — Encounter (HOSPITAL_BASED_OUTPATIENT_CLINIC_OR_DEPARTMENT_OTHER): Payer: Self-pay | Admitting: Orthopedic Surgery

## 2023-03-13 ENCOUNTER — Other Ambulatory Visit: Payer: Self-pay

## 2023-03-13 ENCOUNTER — Emergency Department (HOSPITAL_BASED_OUTPATIENT_CLINIC_OR_DEPARTMENT_OTHER)
Admission: EM | Admit: 2023-03-13 | Discharge: 2023-03-13 | Disposition: A | Discharge status: Home / Self Care | Payer: No Typology Code available for payment source | Attending: Emergency Medicine | Admitting: Emergency Medicine

## 2023-03-13 ENCOUNTER — Emergency Department (HOSPITAL_BASED_OUTPATIENT_CLINIC_OR_DEPARTMENT_OTHER): Payer: No Typology Code available for payment source | Admitting: Radiology

## 2023-03-13 ENCOUNTER — Encounter (HOSPITAL_BASED_OUTPATIENT_CLINIC_OR_DEPARTMENT_OTHER): Payer: Self-pay

## 2023-03-13 DIAGNOSIS — Z79899 Other long term (current) drug therapy: Secondary | ICD-10-CM | POA: Diagnosis not present

## 2023-03-13 DIAGNOSIS — R079 Chest pain, unspecified: Secondary | ICD-10-CM | POA: Insufficient documentation

## 2023-03-13 DIAGNOSIS — R1011 Right upper quadrant pain: Secondary | ICD-10-CM | POA: Diagnosis present

## 2023-03-13 LAB — URINALYSIS, ROUTINE W REFLEX MICROSCOPIC
Bilirubin Urine: NEGATIVE
Glucose, UA: NEGATIVE mg/dL
Hgb urine dipstick: NEGATIVE
Ketones, ur: NEGATIVE mg/dL
Leukocytes,Ua: NEGATIVE
Nitrite: NEGATIVE
Protein, ur: NEGATIVE mg/dL
Specific Gravity, Urine: 1.01 (ref 1.005–1.030)
pH: 6 (ref 5.0–8.0)

## 2023-03-13 LAB — COMPREHENSIVE METABOLIC PANEL
ALT: 16 U/L (ref 0–44)
AST: 19 U/L (ref 15–41)
Albumin: 4 g/dL (ref 3.5–5.0)
Alkaline Phosphatase: 46 U/L (ref 38–126)
Anion gap: 11 (ref 5–15)
BUN: 14 mg/dL (ref 6–20)
CO2: 28 mmol/L (ref 22–32)
Calcium: 9.9 mg/dL (ref 8.9–10.3)
Chloride: 104 mmol/L (ref 98–111)
Creatinine, Ser: 0.76 mg/dL (ref 0.44–1.00)
GFR, Estimated: >60 mL/min (ref >60)
Glucose, Bld: 93 mg/dL (ref 70–99)
Potassium: 3.4 mmol/L — ABNORMAL LOW (ref 3.5–5.1)
Sodium: 143 mmol/L (ref 135–145)
Total Bilirubin: 0.4 mg/dL (ref 0.3–1.2)
Total Protein: 7.3 g/dL (ref 6.5–8.1)

## 2023-03-13 LAB — CBC
HCT: 36.3 % (ref 36.0–46.0)
Hemoglobin: 13 g/dL (ref 12.0–15.0)
MCH: 28.6 pg (ref 26.0–34.0)
MCHC: 35.8 g/dL (ref 30.0–36.0)
MCV: 79.8 fL — ABNORMAL LOW (ref 80.0–100.0)
Platelets: 230 10*3/uL (ref 150–400)
RBC: 4.55 MIL/uL (ref 3.87–5.11)
RDW: 15.5 % (ref 11.5–15.5)
WBC: 8.3 10*3/uL (ref 4.0–10.5)
nRBC: 0 % (ref 0.0–0.2)

## 2023-03-13 LAB — LIPASE, BLOOD: Lipase: 25 U/L (ref 11–51)

## 2023-03-13 LAB — TROPONIN I (HIGH SENSITIVITY): Troponin I (High Sensitivity): 4 ng/L (ref <18)

## 2023-03-13 MED ORDER — ALUM & MAG HYDROXIDE-SIMETH 200-200-20 MG/5ML PO SUSP
30.0000 mL | Freq: Once | ORAL | Status: AC
Start: 2023-03-13 — End: 2023-03-13
  Administered 2023-03-13: 30 mL via ORAL
  Filled 2023-03-13: qty 30

## 2023-03-13 MED ORDER — MORPHINE SULFATE (PF) 4 MG/ML IV SOLN
4.0000 mg | Freq: Once | INTRAVENOUS | Status: AC
  Administered 2023-03-13: 4 mg via INTRAVENOUS
  Filled 2023-03-13: qty 1

## 2023-03-13 MED ORDER — SODIUM CHLORIDE 0.9 % IV BOLUS
1000.0000 mL | Freq: Once | INTRAVENOUS | Status: AC
  Administered 2023-03-13: 1000 mL via INTRAVENOUS

## 2023-03-13 MED ORDER — ONDANSETRON HCL 4 MG/2ML IJ SOLN
4.0000 mg | Freq: Once | INTRAMUSCULAR | Status: AC
  Administered 2023-03-13: 4 mg via INTRAVENOUS
  Filled 2023-03-13: qty 2

## 2023-03-13 NOTE — ED Provider Notes (Signed)
[facility] Provider Note   CSN: [id number] Arrival date & time: 03/13/23  1029     History  Chief Complaint  Patient presents with   Chest Pain    Carly Lynch is a 52 y.o. female.  52 yo F with a chief complaints of right upper quadrant abdominal discomfort.  This been going on for about a week.  Occurred since she had taken a trip to [facility].  Pain seems to be worse with eating sometimes.  No fevers no vomiting.  No difficulty breathing.  No exertional symptoms.  Nothing specifically seems to make it better.  She has had pain like this previously and at 1 point was told that she needed to follow-up with a gastroenterologist to have endoscopy done.  She has not yet set up an appointment for this.   Chest Pain      Home Medications Prior to Admission medications   Medication Sig Start Date End Date Taking? Authorizing Provider  albuterol (PROVENTIL HFA;VENTOLIN HFA) 108 (90 BASE) MCG/ACT inhaler Inhale 1 puff into the lungs every 6 (six) hours as needed for wheezing or shortness of breath.    [provider]  Cholecalciferol 125 MCG (5000 UT) TABS Take 1 tablet by mouth every other day.     [provider]  ibuprofen (ADVIL) 600 MG tablet Take 1 tablet (600 mg total) by mouth every 6 (six) hours as needed. 12/29/21   Lorre Nick, MD  levothyroxine (SYNTHROID, LEVOTHROID) 100 MCG tablet Take 88 mcg by mouth daily before breakfast.    [provider]  liothyronine (CYTOMEL) 5 MCG tablet Take 2.5 mcg by mouth 2 (two) times daily. 10/25/18   [provider]  lisinopril-hydrochlorothiazide (ZESTORETIC) 20-25 MG tablet Take 1 tablet by mouth daily.    [provider]  Multiple Vitamin (MULTIVITAMIN) capsule Take 1 capsule by mouth daily. centrum    [provider]  promethazine (PHENERGAN) 12.5 MG tablet Take 1 tablet (12.5 mg total) by mouth every 6 (six) hours as  needed for nausea or vomiting. 03/15/22   Ernestina Columbia, MD  traMADol (ULTRAM) 50 MG tablet Take 1 tablet (50 mg total) by mouth every 6 (six) hours as needed (FOR POSTOPERATIVE PAIN). 03/15/22 03/15/23  Ernestina Columbia, MD  TURMERIC PO Take 1 tablet by mouth 2 (two) times daily.    [provider]  UNABLE TO FIND Med Name: liothyronine sodium (T3 SR 50% Methocel)    [provider]      Allergies    Ciprofloxin hcl [ciprofloxacin] and Percocet [oxycodone-acetaminophen]    Review of Systems   Review of Systems  Cardiovascular:  Positive for chest pain.    Physical Exam Updated Vital Signs BP (!) 161/66   Pulse (!) 58   Temp 98.2 F (36.8 C) (Oral)   Resp 13   Ht 5\' 5"  (1.651 m)   Wt 89.8 kg   SpO2 100%   BMI 32.95 kg/m  Physical Exam Vitals and nursing note reviewed.  Constitutional:      General: She is not in acute distress.    Appearance: She is well-developed. She is not diaphoretic.  HENT:     Head: Normocephalic and atraumatic.  Eyes:     Pupils: Pupils are equal, round, and reactive to light.  Cardiovascular:     Rate and Rhythm: Normal rate and regular rhythm.     Heart sounds: No murmur heard.    No friction rub.  No gallop.  Pulmonary:     Effort: Pulmonary effort is normal.     Breath sounds: No wheezing or rales.  Abdominal:     General: There is no distension.     Palpations: Abdomen is soft.     Tenderness: There is abdominal tenderness.     Comments: Diffuse upper abdominal pain slightly worse in the right upper quadrant than the rest.  Negative Murphy sign.  Musculoskeletal:        General: No tenderness.     Cervical back: Normal range of motion and neck supple.  Skin:    General: Skin is warm and dry.  Neurological:     Mental Status: She is alert and oriented to person, place, and time.  Psychiatric:        Behavior: Behavior normal.     ED Results / Procedures / Treatments   Labs (all labs ordered are listed, but only  abnormal results are displayed) Labs Reviewed  CBC - Abnormal; Notable for the following components:      Result Value   MCV 79.8 (*)    All other components within normal limits  COMPREHENSIVE METABOLIC PANEL - Abnormal; Notable for the following components:   Potassium 3.4 (*)    All other components within normal limits  URINALYSIS, ROUTINE W REFLEX MICROSCOPIC - Abnormal; Notable for the following components:   Color, Urine COLORLESS (*)    All other components within normal limits  LIPASE, BLOOD  TROPONIN I (HIGH SENSITIVITY)    EKG EKG Interpretation  Date/Time:  Sunday March 13 2023 10:45:55 EDT Ventricular Rate:  64 PR Interval:  204 QRS Duration: 96 QT Interval:  416 QTC Calculation: 429 R Axis:   -29 Text Interpretation: Normal sinus rhythm Minimal voltage criteria for LVH, may be normal variant ( R in aVL ) Nonspecific T wave abnormality Abnormal ECG No significant change since last tracing Confirmed by Melene Plan [id number]) on 03/13/2023 11:01:05 AM  Radiology DG Chest 2 View  Result Date: 03/13/2023 CLINICAL DATA:  Intermittent right lower chest pain for the past week following an airplane trip. Shortness of breath since yesterday. Mild nausea since this morning. EXAM: CHEST - 2 VIEW COMPARISON:  11/07/2018 FINDINGS: The heart size and mediastinal contours are within normal limits. Both lungs are clear. The visualized skeletal structures are unremarkable. IMPRESSION: No active cardiopulmonary disease. Electronically Signed   By: Beckie Salts M.D.   On: 03/13/2023 11:25    Procedures Procedures    Medications Ordered in ED Medications  sodium chloride 0.9 % bolus 1,000 mL (1,000 mLs Intravenous New Bag/Given 03/13/23 1137)  morphine (PF) 4 MG/ML injection 4 mg (4 mg Intravenous Given 03/13/23 1141)  ondansetron (ZOFRAN) injection 4 mg (4 mg Intravenous Given 03/13/23 1142)  alum & mag hydroxide-simeth (MAALOX/MYLANTA) 200-200-20 MG/5ML suspension 30 mL (30 mLs Oral Given  03/13/23 1141)    ED Course/ Medical Decision Making/ A&P                             Medical Decision Making Amount and/or Complexity of Data Reviewed Labs: ordered. Radiology: ordered.  Risk OTC drugs. Prescription drug management.   52 yo F with a chief complaints of right upper quadrant abdominal pain.  Going on for the past week.  No fevers no vomiting.  Has had ultrasonography done for this previously that was negative.  Most likely reflux by history and physical.  Will obtain blood work.  The patient initially was triaged as the chest pain and so she had a chest x-ray, this was independently interpreted by me without focal infiltrate or pneumothorax.  Treat pain and nausea, reassess.   LFTs and lipase unremarkable.  No leukocytosis.  I discussed results with the patient.  She decision making at bedside will not obtain a right upper quadrant ultrasound as I feel it would not be helpful at this time.  Will trial H2 blockers.  PCP follow-up.  Patient was worried about pain with urination.  I obtained a UA.  Negative for infection.  12:50 PM:  I have discussed the diagnosis/risks/treatment options with the patient and family.  Evaluation and diagnostic testing in the emergency department does not suggest an emergent condition requiring admission or immediate intervention beyond what has been performed at this time.  They will follow up with PCP. We also discussed returning to the ED immediately if new or worsening sx occur. We discussed the sx which are most concerning (e.g., sudden worsening pain, fever, inability to tolerate by mouth) that necessitate immediate return. Medications administered to the patient during their visit and any new prescriptions provided to the patient are listed below.  Medications given during this visit Medications  sodium chloride 0.9 % bolus 1,000 mL (1,000 mLs Intravenous New Bag/Given 03/13/23 1137)  morphine (PF) 4 MG/ML injection 4 mg (4 mg Intravenous  Given 03/13/23 1141)  ondansetron (ZOFRAN) injection 4 mg (4 mg Intravenous Given 03/13/23 1142)  alum & mag hydroxide-simeth (MAALOX/MYLANTA) 200-200-20 MG/5ML suspension 30 mL (30 mLs Oral Given 03/13/23 1141)     The patient appears reasonably screen and/or stabilized for discharge and I doubt any other medical condition or other [facility] requiring further screening, evaluation, or treatment in the ED at this time prior to discharge.          Final Clinical Impression(s) / ED Diagnoses Final diagnoses:  Right upper quadrant abdominal pain    Rx / DC Orders ED Discharge Orders     None         Melene PlanFloyd, Fintan Grater, DO 03/13/23 1250

## 2023-03-13 NOTE — ED Notes (Signed)
Patient transported to X-ray 

## 2023-03-13 NOTE — ED Triage Notes (Addendum)
Pt reports intermittent right lower chest pain for past week after returning from [place] on an airplane, along with sob that started yesterday. Pt reports mild nausea starting this am.  Pt took ibuprofen at home last night with min relief.  Pt did not take BP med this morning.

## 2023-03-13 NOTE — Discharge Instructions (Addendum)
Your urine did not show any signs of infection Try pepcid or tagamet up to twice a day.  Try to avoid things that may make this worse, most commonly these are spicy foods tomato based products fatty foods chocolate and peppermint.  Alcohol and tobacco can also make this worse.  Return to the emergency department for sudden worsening pain fever or inability to eat or drink.

## 2023-06-07 ENCOUNTER — Other Ambulatory Visit (HOSPITAL_BASED_OUTPATIENT_CLINIC_OR_DEPARTMENT_OTHER): Payer: Self-pay

## 2023-06-07 MED ORDER — ZEPBOUND 2.5 MG/0.5ML ~~LOC~~ SOAJ
2.5000 mg | SUBCUTANEOUS | 0 refills | Status: DC
  Filled 2023-06-07: qty 2, 28d supply, fill #0

## 2023-06-10 ENCOUNTER — Other Ambulatory Visit (HOSPITAL_BASED_OUTPATIENT_CLINIC_OR_DEPARTMENT_OTHER): Payer: Self-pay

## 2023-07-06 ENCOUNTER — Other Ambulatory Visit (HOSPITAL_BASED_OUTPATIENT_CLINIC_OR_DEPARTMENT_OTHER): Payer: Self-pay

## 2023-07-06 MED ORDER — ZEPBOUND 5 MG/0.5ML ~~LOC~~ SOAJ
5.0000 mg | SUBCUTANEOUS | 0 refills | Status: DC
  Filled 2023-07-06: qty 2, 28d supply, fill #0

## 2023-07-25 ENCOUNTER — Other Ambulatory Visit (HOSPITAL_BASED_OUTPATIENT_CLINIC_OR_DEPARTMENT_OTHER): Payer: Self-pay

## 2023-07-25 MED ORDER — ZEPBOUND 5 MG/0.5ML ~~LOC~~ SOAJ
5.0000 mg | SUBCUTANEOUS | 0 refills | Status: DC
  Filled 2023-07-25 – 2023-08-06 (×2): qty 2, 28d supply, fill #0

## 2023-08-06 ENCOUNTER — Other Ambulatory Visit (HOSPITAL_BASED_OUTPATIENT_CLINIC_OR_DEPARTMENT_OTHER): Payer: Self-pay

## 2023-08-24 ENCOUNTER — Other Ambulatory Visit (HOSPITAL_BASED_OUTPATIENT_CLINIC_OR_DEPARTMENT_OTHER): Payer: Self-pay

## 2023-08-24 MED ORDER — TIRZEPATIDE-WEIGHT MANAGEMENT 5 MG/0.5ML ~~LOC~~ SOAJ
5.0000 mg | SUBCUTANEOUS | 0 refills | Status: AC
  Filled 2023-08-24: qty 2, 28d supply, fill #0

## 2023-09-13 ENCOUNTER — Other Ambulatory Visit (HOSPITAL_BASED_OUTPATIENT_CLINIC_OR_DEPARTMENT_OTHER): Payer: Self-pay

## 2023-09-13 MED ORDER — ZEPBOUND 7.5 MG/0.5ML ~~LOC~~ SOAJ
7.5000 mg | SUBCUTANEOUS | 0 refills | Status: DC
  Filled 2023-09-13: qty 2, 28d supply, fill #0

## 2023-10-03 ENCOUNTER — Other Ambulatory Visit (HOSPITAL_BASED_OUTPATIENT_CLINIC_OR_DEPARTMENT_OTHER): Payer: Self-pay

## 2023-10-03 MED ORDER — ZEPBOUND 5 MG/0.5ML ~~LOC~~ SOAJ
5.0000 mg | SUBCUTANEOUS | 3 refills | Status: AC
  Filled 2023-10-03: qty 2, 28d supply, fill #0

## 2023-10-03 MED ORDER — ZEPBOUND 7.5 MG/0.5ML ~~LOC~~ SOAJ
7.5000 mg | SUBCUTANEOUS | 11 refills | Status: AC
  Filled 2023-10-03 – 2023-10-05 (×3): qty 2, 28d supply, fill #0
  Filled 2023-10-24 – 2023-11-02 (×2): qty 2, 28d supply, fill #1

## 2023-10-05 ENCOUNTER — Other Ambulatory Visit (HOSPITAL_BASED_OUTPATIENT_CLINIC_OR_DEPARTMENT_OTHER): Payer: Self-pay

## 2023-10-21 ENCOUNTER — Other Ambulatory Visit (HOSPITAL_BASED_OUTPATIENT_CLINIC_OR_DEPARTMENT_OTHER): Payer: Self-pay

## 2023-10-24 ENCOUNTER — Other Ambulatory Visit (HOSPITAL_BASED_OUTPATIENT_CLINIC_OR_DEPARTMENT_OTHER): Payer: Self-pay

## 2023-10-28 ENCOUNTER — Other Ambulatory Visit (HOSPITAL_BASED_OUTPATIENT_CLINIC_OR_DEPARTMENT_OTHER): Payer: Self-pay

## 2023-11-02 ENCOUNTER — Other Ambulatory Visit (HOSPITAL_COMMUNITY): Payer: Self-pay

## 2023-11-02 ENCOUNTER — Other Ambulatory Visit (HOSPITAL_BASED_OUTPATIENT_CLINIC_OR_DEPARTMENT_OTHER): Payer: Self-pay

## 2023-11-22 ENCOUNTER — Other Ambulatory Visit (HOSPITAL_BASED_OUTPATIENT_CLINIC_OR_DEPARTMENT_OTHER): Payer: Self-pay

## 2023-11-22 MED ORDER — ZEPBOUND 10 MG/0.5ML ~~LOC~~ SOAJ
10.0000 mg | SUBCUTANEOUS | 11 refills | Status: AC
  Filled 2023-11-22: qty 2, 28d supply, fill #0
  Filled 2023-12-21: qty 2, 28d supply, fill #1

## 2023-11-25 ENCOUNTER — Other Ambulatory Visit (HOSPITAL_BASED_OUTPATIENT_CLINIC_OR_DEPARTMENT_OTHER): Payer: Self-pay

## 2023-12-21 ENCOUNTER — Other Ambulatory Visit (HOSPITAL_BASED_OUTPATIENT_CLINIC_OR_DEPARTMENT_OTHER): Payer: Self-pay

## 2024-01-11 ENCOUNTER — Other Ambulatory Visit (HOSPITAL_BASED_OUTPATIENT_CLINIC_OR_DEPARTMENT_OTHER): Payer: Self-pay

## 2024-01-11 MED ORDER — ZEPBOUND 12.5 MG/0.5ML ~~LOC~~ SOAJ
12.5000 mg | SUBCUTANEOUS | 0 refills | Status: AC
  Filled 2024-01-11: qty 2, 28d supply, fill #0

## 2024-02-10 ENCOUNTER — Other Ambulatory Visit (HOSPITAL_BASED_OUTPATIENT_CLINIC_OR_DEPARTMENT_OTHER): Payer: Self-pay

## 2024-02-10 MED ORDER — ZEPBOUND 15 MG/0.5ML ~~LOC~~ SOAJ
15.0000 mg | SUBCUTANEOUS | 0 refills | Status: DC
Start: 2024-02-10 — End: 2024-03-09
  Filled 2024-02-10 (×2): qty 2, 28d supply, fill #0

## 2024-02-11 ENCOUNTER — Other Ambulatory Visit (HOSPITAL_BASED_OUTPATIENT_CLINIC_OR_DEPARTMENT_OTHER): Payer: Self-pay

## 2024-03-09 ENCOUNTER — Other Ambulatory Visit (HOSPITAL_BASED_OUTPATIENT_CLINIC_OR_DEPARTMENT_OTHER): Payer: Self-pay

## 2024-03-09 MED ORDER — ZEPBOUND 15 MG/0.5ML ~~LOC~~ SOAJ
15.0000 mg | SUBCUTANEOUS | 0 refills | Status: DC
  Filled 2024-03-09: qty 2, 28d supply, fill #0

## 2024-03-13 ENCOUNTER — Other Ambulatory Visit (HOSPITAL_BASED_OUTPATIENT_CLINIC_OR_DEPARTMENT_OTHER): Payer: Self-pay

## 2024-04-16 ENCOUNTER — Other Ambulatory Visit (HOSPITAL_BASED_OUTPATIENT_CLINIC_OR_DEPARTMENT_OTHER): Payer: Self-pay

## 2024-04-17 ENCOUNTER — Other Ambulatory Visit (HOSPITAL_BASED_OUTPATIENT_CLINIC_OR_DEPARTMENT_OTHER): Payer: Self-pay

## 2024-04-17 MED ORDER — ZEPBOUND 15 MG/0.5ML ~~LOC~~ SOAJ
15.0000 mg | SUBCUTANEOUS | 0 refills | Status: DC
Start: 2024-04-17 — End: 2024-05-16
  Filled 2024-04-17: qty 2, 28d supply, fill #0

## 2024-05-16 ENCOUNTER — Other Ambulatory Visit (HOSPITAL_BASED_OUTPATIENT_CLINIC_OR_DEPARTMENT_OTHER): Payer: Self-pay

## 2024-05-16 MED ORDER — ZEPBOUND 15 MG/0.5ML ~~LOC~~ SOAJ
15.0000 mg | SUBCUTANEOUS | 0 refills | Status: AC
  Filled 2024-05-16 – 2024-06-02 (×2): qty 2, 28d supply, fill #0

## 2024-05-26 ENCOUNTER — Other Ambulatory Visit (HOSPITAL_BASED_OUTPATIENT_CLINIC_OR_DEPARTMENT_OTHER): Payer: Self-pay

## 2024-06-02 ENCOUNTER — Other Ambulatory Visit (HOSPITAL_BASED_OUTPATIENT_CLINIC_OR_DEPARTMENT_OTHER): Payer: Self-pay

## 2024-07-12 ENCOUNTER — Other Ambulatory Visit: Payer: Self-pay

## 2024-07-12 ENCOUNTER — Other Ambulatory Visit (HOSPITAL_BASED_OUTPATIENT_CLINIC_OR_DEPARTMENT_OTHER): Payer: Self-pay

## 2024-07-12 MED ORDER — WEGOVY 1 MG/0.5ML ~~LOC~~ SOAJ
1.0000 mg | SUBCUTANEOUS | 11 refills | Status: AC
  Filled 2024-07-12: qty 2, 28d supply, fill #0

## 2024-07-12 MED ORDER — ESTRADIOL 0.025 MG/24HR TD PTTW
1.0000 | MEDICATED_PATCH | TRANSDERMAL | 3 refills | Status: AC
  Filled 2024-07-12: qty 24, 84d supply, fill #0

## 2024-07-12 MED ORDER — LIOTHYRONINE SODIUM 5 MCG PO TABS
5.0000 ug | ORAL_TABLET | Freq: Two times a day (BID) | ORAL | 3 refills | Status: AC
  Filled 2024-07-12: qty 180, 90d supply, fill #0
  Filled 2024-08-09: qty 60, 30d supply, fill #0
  Filled 2024-09-13: qty 60, 30d supply, fill #1

## 2024-07-12 MED ORDER — PROGESTERONE MICRONIZED 100 MG PO CAPS
100.0000 mg | ORAL_CAPSULE | Freq: Every day | ORAL | 3 refills | Status: AC
  Filled 2024-07-12: qty 90, 90d supply, fill #0

## 2024-07-18 ENCOUNTER — Other Ambulatory Visit (HOSPITAL_BASED_OUTPATIENT_CLINIC_OR_DEPARTMENT_OTHER): Payer: Self-pay

## 2024-07-18 MED ORDER — WEGOVY 2.4 MG/0.75ML ~~LOC~~ SOAJ
2.4000 mg | SUBCUTANEOUS | 0 refills | Status: AC
  Filled 2024-07-18 (×2): qty 3, 28d supply, fill #0

## 2024-07-20 ENCOUNTER — Other Ambulatory Visit (HOSPITAL_BASED_OUTPATIENT_CLINIC_OR_DEPARTMENT_OTHER): Payer: Self-pay

## 2024-07-23 ENCOUNTER — Other Ambulatory Visit (HOSPITAL_BASED_OUTPATIENT_CLINIC_OR_DEPARTMENT_OTHER): Payer: Self-pay

## 2024-07-25 ENCOUNTER — Other Ambulatory Visit (HOSPITAL_BASED_OUTPATIENT_CLINIC_OR_DEPARTMENT_OTHER): Payer: Self-pay

## 2024-07-25 MED ORDER — WEGOVY 0.25 MG/0.5ML ~~LOC~~ SOAJ
0.2500 mg | SUBCUTANEOUS | 0 refills | Status: AC
  Filled 2024-08-09: qty 2, 28d supply, fill #0

## 2024-08-09 ENCOUNTER — Other Ambulatory Visit (HOSPITAL_BASED_OUTPATIENT_CLINIC_OR_DEPARTMENT_OTHER): Payer: Self-pay

## 2024-08-31 ENCOUNTER — Other Ambulatory Visit (HOSPITAL_BASED_OUTPATIENT_CLINIC_OR_DEPARTMENT_OTHER): Payer: Self-pay

## 2024-08-31 MED ORDER — MOUNJARO 2.5 MG/0.5ML ~~LOC~~ SOAJ
2.5000 mg | SUBCUTANEOUS | 0 refills | Status: AC
  Filled 2024-08-31: qty 2, 28d supply, fill #0

## 2024-09-03 ENCOUNTER — Other Ambulatory Visit (HOSPITAL_BASED_OUTPATIENT_CLINIC_OR_DEPARTMENT_OTHER): Payer: Self-pay

## 2024-09-03 MED ORDER — CEPHALEXIN 500 MG PO CAPS
500.0000 mg | ORAL_CAPSULE | Freq: Three times a day (TID) | ORAL | 0 refills | Status: AC
  Filled 2024-09-03: qty 12, 4d supply, fill #0

## 2024-09-04 ENCOUNTER — Other Ambulatory Visit (HOSPITAL_BASED_OUTPATIENT_CLINIC_OR_DEPARTMENT_OTHER): Payer: Self-pay

## 2024-09-05 ENCOUNTER — Other Ambulatory Visit (HOSPITAL_BASED_OUTPATIENT_CLINIC_OR_DEPARTMENT_OTHER): Payer: Self-pay | Admitting: Obstetrics & Gynecology

## 2024-09-05 DIAGNOSIS — E039 Hypothyroidism, unspecified: Secondary | ICD-10-CM

## 2024-09-28 ENCOUNTER — Other Ambulatory Visit (HOSPITAL_BASED_OUTPATIENT_CLINIC_OR_DEPARTMENT_OTHER): Payer: Self-pay

## 2024-09-28 MED ORDER — MOUNJARO 5 MG/0.5ML ~~LOC~~ SOAJ
5.0000 mg | SUBCUTANEOUS | 0 refills | Status: AC
  Filled 2024-09-28: qty 2, 28d supply, fill #0

## 2024-09-28 MED ORDER — PROGESTERONE MICRONIZED 100 MG PO CAPS
100.0000 mg | ORAL_CAPSULE | Freq: Every evening | ORAL | 3 refills | Status: AC
  Filled 2024-09-28: qty 90, 90d supply, fill #0

## 2024-10-15 ENCOUNTER — Other Ambulatory Visit (HOSPITAL_BASED_OUTPATIENT_CLINIC_OR_DEPARTMENT_OTHER): Payer: Self-pay

## 2024-10-16 ENCOUNTER — Other Ambulatory Visit (HOSPITAL_BASED_OUTPATIENT_CLINIC_OR_DEPARTMENT_OTHER): Payer: Self-pay

## 2024-10-18 ENCOUNTER — Other Ambulatory Visit (HOSPITAL_BASED_OUTPATIENT_CLINIC_OR_DEPARTMENT_OTHER): Payer: Self-pay

## 2024-10-18 MED ORDER — MOUNJARO 7.5 MG/0.5ML ~~LOC~~ SOAJ
7.5000 mg | SUBCUTANEOUS | 0 refills | Status: AC
  Filled 2024-10-18: qty 2, 28d supply, fill #0

## 2024-10-19 ENCOUNTER — Other Ambulatory Visit (HOSPITAL_BASED_OUTPATIENT_CLINIC_OR_DEPARTMENT_OTHER): Payer: Self-pay

## 2024-10-23 ENCOUNTER — Other Ambulatory Visit (HOSPITAL_BASED_OUTPATIENT_CLINIC_OR_DEPARTMENT_OTHER): Payer: Self-pay

## 2024-10-23 MED ORDER — ONDANSETRON HCL 4 MG PO TABS
4.0000 mg | ORAL_TABLET | Freq: Three times a day (TID) | ORAL | 0 refills | Status: AC | PRN
  Filled 2024-10-23: qty 18, 6d supply, fill #0

## 2024-10-30 ENCOUNTER — Other Ambulatory Visit (HOSPITAL_BASED_OUTPATIENT_CLINIC_OR_DEPARTMENT_OTHER): Payer: Self-pay

## 2024-10-30 MED ORDER — THYROID 90 MG PO TABS
45.0000 mg | ORAL_TABLET | Freq: Two times a day (BID) | ORAL | 6 refills | Status: AC
  Filled 2024-10-30: qty 30, 30d supply, fill #0
  Filled 2024-11-28: qty 30, 30d supply, fill #1
  Filled 2024-12-28: qty 30, 30d supply, fill #2

## 2024-11-19 ENCOUNTER — Other Ambulatory Visit (HOSPITAL_BASED_OUTPATIENT_CLINIC_OR_DEPARTMENT_OTHER): Payer: Self-pay

## 2024-11-19 MED ORDER — AMOXICILLIN-POT CLAVULANATE 875-125 MG PO TABS
ORAL_TABLET | ORAL | 0 refills | Status: AC
  Filled 2024-11-19: qty 20, 10d supply, fill #0

## 2024-11-22 ENCOUNTER — Other Ambulatory Visit: Payer: Self-pay

## 2024-11-22 ENCOUNTER — Other Ambulatory Visit (HOSPITAL_BASED_OUTPATIENT_CLINIC_OR_DEPARTMENT_OTHER): Payer: Self-pay

## 2024-11-22 MED FILL — Tirzepatide Soln Auto-injector 7.5 MG/0.5ML: 7.5000 mg | SUBCUTANEOUS | 28 days supply | Qty: 2 | Fill #0 | Status: AC

## 2024-11-28 ENCOUNTER — Other Ambulatory Visit (HOSPITAL_BASED_OUTPATIENT_CLINIC_OR_DEPARTMENT_OTHER): Payer: Self-pay

## 2024-12-26 ENCOUNTER — Other Ambulatory Visit (HOSPITAL_BASED_OUTPATIENT_CLINIC_OR_DEPARTMENT_OTHER): Payer: Self-pay

## 2024-12-26 MED ORDER — MOUNJARO 7.5 MG/0.5ML ~~LOC~~ SOAJ
7.5000 mg | SUBCUTANEOUS | 0 refills | Status: AC
  Filled 2024-12-26: qty 2, 28d supply, fill #0

## 2024-12-28 ENCOUNTER — Other Ambulatory Visit (HOSPITAL_BASED_OUTPATIENT_CLINIC_OR_DEPARTMENT_OTHER): Payer: Self-pay

## 2025-01-11 ENCOUNTER — Other Ambulatory Visit (HOSPITAL_BASED_OUTPATIENT_CLINIC_OR_DEPARTMENT_OTHER): Payer: Self-pay

## 2025-01-11 MED ORDER — THYROID 90 MG PO TABS: 45.0000 mg | ORAL_TABLET | Freq: Two times a day (BID) | ORAL | 3 refills | Status: AC
